# Patient Record
Sex: Male | Born: 1950
Health system: Southern US, Community
[De-identification: ages and names within clinical notes are randomized; demographics above are authoritative.]

## PROBLEM LIST (undated history)

## (undated) DIAGNOSIS — R1310 Dysphagia, unspecified: Secondary | ICD-10-CM

## (undated) DIAGNOSIS — I10 Essential (primary) hypertension: Secondary | ICD-10-CM

## (undated) DIAGNOSIS — E785 Hyperlipidemia, unspecified: Secondary | ICD-10-CM

## (undated) DIAGNOSIS — I2510 Atherosclerotic heart disease of native coronary artery without angina pectoris: Secondary | ICD-10-CM

## (undated) HISTORY — PX: HERNIA REPAIR: SHX51

## (undated) HISTORY — DX: Essential (primary) hypertension: I10

## (undated) HISTORY — DX: Dysphagia, unspecified: R13.10

## (undated) HISTORY — DX: Hyperlipidemia, unspecified: E78.5

## (undated) HISTORY — PX: CORONARY ANGIOPLASTY WITH STENT PLACEMENT: SHX49

---

## 2003-06-18 ENCOUNTER — Other Ambulatory Visit: Payer: Self-pay

## 2008-09-17 ENCOUNTER — Emergency Department: Payer: Self-pay | Admitting: Emergency Medicine

## 2008-10-18 ENCOUNTER — Ambulatory Visit: Payer: Self-pay | Admitting: General Surgery

## 2008-10-23 ENCOUNTER — Ambulatory Visit: Payer: Self-pay | Admitting: General Surgery

## 2008-10-28 ENCOUNTER — Ambulatory Visit: Payer: Self-pay | Admitting: General Surgery

## 2010-02-27 ENCOUNTER — Emergency Department: Payer: Self-pay | Admitting: Emergency Medicine

## 2016-02-16 ENCOUNTER — Telehealth: Payer: Self-pay | Admitting: Internal Medicine

## 2016-02-16 ENCOUNTER — Encounter: Payer: Self-pay | Admitting: Unknown Physician Specialty

## 2016-02-16 ENCOUNTER — Ambulatory Visit (INDEPENDENT_AMBULATORY_CARE_PROVIDER_SITE_OTHER): Payer: PRIVATE HEALTH INSURANCE | Admitting: Unknown Physician Specialty

## 2016-02-16 VITALS — BP 153/91 | HR 66 | Temp 98.3°F | Ht 68.0 in | Wt 176.2 lb

## 2016-02-16 DIAGNOSIS — I10 Essential (primary) hypertension: Secondary | ICD-10-CM

## 2016-02-16 DIAGNOSIS — Z0000 Encounter for general adult medical examination without abnormal findings: Secondary | ICD-10-CM

## 2016-02-16 DIAGNOSIS — Z23 Encounter for immunization: Secondary | ICD-10-CM

## 2016-02-16 DIAGNOSIS — I2510 Atherosclerotic heart disease of native coronary artery without angina pectoris: Secondary | ICD-10-CM | POA: Diagnosis not present

## 2016-02-16 DIAGNOSIS — K222 Esophageal obstruction: Secondary | ICD-10-CM | POA: Diagnosis not present

## 2016-02-16 DIAGNOSIS — E785 Hyperlipidemia, unspecified: Secondary | ICD-10-CM

## 2016-02-16 MED ORDER — LISINOPRIL 10 MG PO TABS: 10.0000 mg | ORAL_TABLET | Freq: Every day | ORAL | 1 refills | Status: DC

## 2016-02-16 NOTE — Assessment & Plan Note (Signed)
Lost to f/u.

## 2016-02-16 NOTE — Patient Instructions (Signed)
Pneumococcal Conjugate Vaccine (PCV13)   1. Why get vaccinated?  Vaccination can protect both children and adults from pneumococcal disease.  Pneumococcal disease is caused by bacteria that can spread from person to person through close contact. It can cause ear infections, and it can also lead to more serious infections of the:  · Lungs (pneumonia),  · Blood (bacteremia), and  · Covering of the brain and spinal cord (meningitis).  Pneumococcal pneumonia is most common among adults. Pneumococcal meningitis can cause deafness and brain damage, and it kills about 1 child in 10 who get it.  Anyone can get pneumococcal disease, but children under 2 years of age and adults 65 years and older, people with certain medical conditions, and cigarette smokers are at the highest risk.  Before there was a vaccine, the [place] saw:  · more than 700 cases of meningitis,  · about 13,000 blood infections,  · about 5 million ear infections, and  · about 200 deaths  in children under 5 each year from pneumococcal disease. Since vaccine became available, severe pneumococcal disease in these children has fallen by 88%.  About 18,000 older adults die of pneumococcal disease each year in the [place].  Treatment of pneumococcal infections with penicillin and other drugs is not as effective as it used to be, because some strains of the disease have become resistant to these drugs. This makes prevention of the disease, through vaccination, even more important.  2. PCV13 vaccine  Pneumococcal conjugate vaccine (called PCV13) protects against 13 types of pneumococcal bacteria.  PCV13 is routinely given to children at 2, 4, 6, and 12-15 months of age. It is also recommended for children and adults 2 to 64 years of age with certain health conditions, and for all adults 65 years of age and older. Your doctor can give you details.  3. Some people should not get this vaccine  Anyone who has ever had a life-threatening allergic reaction  to a dose of this vaccine, to an earlier pneumococcal vaccine called PCV7, or to any vaccine containing diphtheria toxoid (for example, DTaP), should not get PCV13.  Anyone with a severe allergy to any component of PCV13 should not get the vaccine. Tell your doctor if the person being vaccinated has any severe allergies.  If the person scheduled for vaccination is not feeling well, your healthcare provider might decide to reschedule the shot on another day.  4. Risks of a vaccine reaction  With any medicine, including vaccines, there is a chance of reactions. These are usually mild and go away on their own, but serious reactions are also possible.  Problems reported following PCV13 varied by age and dose in the series. The most common problems reported among children were:  · About half became drowsy after the shot, had a temporary loss of appetite, or had redness or tenderness where the shot was given.  · About 1 out of 3 had swelling where the shot was given.  · About 1 out of 3 had a mild fever, and about 1 in 20 had a fever over 102.2°F.  · Up to about 8 out of 10 became fussy or irritable.  Adults have reported pain, redness, and swelling where the shot was given; also mild fever, fatigue, headache, chills, or muscle pain.  Young children who get PCV13 along with inactivated flu vaccine at the same time may be at increased risk for seizures caused by fever. Ask your doctor for more information.  Problems that   could happen after any vaccine:  · People sometimes faint after a medical procedure, including vaccination. Sitting or lying down for about 15 minutes can help prevent fainting, and injuries caused by a fall. Tell your doctor if you feel dizzy, or have vision changes or ringing in the ears.  · Some older children and adults get severe pain in the shoulder and have difficulty moving the arm where a shot was given. This happens very rarely.  · Any medication can cause a severe allergic reaction. Such  reactions from a vaccine are very rare, estimated at about 1 in a million doses, and would happen within a few minutes to a few hours after the vaccination.  As with any medicine, there is a very small chance of a vaccine causing a serious injury or death.  The safety of vaccines is always being monitored. For more information, visit: www.cdc.gov/vaccinesafety/  5. What if there is a serious reaction?  What should I look for?  · Look for anything that concerns you, such as signs of a severe allergic reaction, very high fever, or unusual behavior.  Signs of a severe allergic reaction can include hives, swelling of the face and throat, difficulty breathing, a fast heartbeat, dizziness, and weakness-usually within a few minutes to a few hours after the vaccination.  What should I do?  · If you think it is a severe allergic reaction or other emergency that can't wait, call 9-1-1 or get the person to the nearest hospital. Otherwise, call your doctor.  Reactions should be reported to the Vaccine Adverse Event Reporting System (VAERS). Your doctor should file this report, or you can do it yourself through the VAERS web site at www.vaers.hhs.gov, or by calling 1-800-822-7967.  VAERS does not give medical advice.  6. The National Vaccine Injury Compensation Program  The National Vaccine Injury Compensation Program (VICP) is a federal program that was created to compensate people who may have been injured by certain vaccines.  Persons who believe they may have been injured by a vaccine can learn about the program and about filing a claim by calling 1-800-338-2382 or visiting the VICP website at www.hrsa.gov/vaccinecompensation. There is a time limit to file a claim for compensation.  7. How can I learn more?  · Ask your healthcare provider. He or she can give you the vaccine package insert or suggest other sources of information.  · Call your local or state health department.  · Contact the Centers for Disease Control and  Prevention (CDC):    Call 1-800-232-4636 (1-800-CDC-INFO) or    Visit CDC's website at www.cdc.gov/vaccines  Vaccine Information Statement  PCV13 Vaccine (05/02/2014)     This information is not intended to replace advice given to you by your health care provider. Make sure you discuss any questions you have with your health care provider.     Document Released: 04/11/2006 Document Revised: 07/05/2014 Document Reviewed: 05/09/2014  Elsevier Interactive Patient Education ©2016 [employer] Inc[employer]

## 2016-02-16 NOTE — Telephone Encounter (Signed)
Patient is aware 

## 2016-02-16 NOTE — Assessment & Plan Note (Signed)
Refer to GI 

## 2016-02-16 NOTE — Progress Notes (Signed)
BP (!) 153/91 (BP Location: Left Arm, Cuff Size: Large)   Pulse 66   Temp 98.3 F (36.8 C)   Ht 5\' 8"  (1.727 m)   Wt 176 lb 3.2 oz (79.9 kg)   SpO2 97%   BMI 26.79 kg/m    Subjective:    Patient ID: Michael Zuniga, male    DOB: 01-06-1951, 65 y.o.   MRN: [id number]  HPI: Michael Zuniga is a 65 y.o. male  Chief Complaint  Patient presents with  . Establish Care  . Labs Only    Hep C order entered   Pt is here to reestablish as a pt.  His primary concern is problems with swallowing.  States he sometimes tries to swallow something and can't get it all the way down.  The other day it lasted for about 24 hours.  Could not even swallow water without vomiting.  He is taking no medications for GERD  Hypertension Had been on BP meds in the past.   Average home BPs 120-130's/80   No problems or lightheadedness No chest pain with exertion or shortness of breath No Edema  CAD Lost to f/u.  Can't tell me when he got his stent. Social History   Social History  . Marital status: Married    Spouse name: N/A  . Number of children: N/A  . Years of education: N/A   Occupational History  . Not on file.   Social History Main Topics  . Smoking status: Never Smoker  . Smokeless tobacco: Never Used  . Alcohol use No  . Drug use: No  . Sexual activity: Yes   Other Topics Concern  . Not on file   Social History Narrative  . No narrative on file   Family History  Problem Relation Age of Onset  . Thyroid disease Daughter   . Arthritis Maternal Grandmother    History reviewed. No pertinent past medical history.  Past Surgical History:  Procedure Laterality Date  . CORONARY ANGIOPLASTY WITH STENT PLACEMENT    . HERNIA REPAIR      Relevant past medical, surgical, family and social history reviewed and updated as indicated. Interim medical history since our last visit reviewed. Allergies and medications reviewed and updated.  Review of Systems  Musculoskeletal: Positive for  arthralgias and myalgias.    Per HPI unless specifically indicated above     Objective:    BP (!) 153/91 (BP Location: Left Arm, Cuff Size: Large)   Pulse 66   Temp 98.3 F (36.8 C)   Ht 5\' 8"  (1.727 m)   Wt 176 lb 3.2 oz (79.9 kg)   SpO2 97%   BMI 26.79 kg/m   Wt Readings from Last 3 Encounters:  02/16/16 176 lb 3.2 oz (79.9 kg)    Physical Exam  Constitutional: He is oriented to person, place, and time. He appears well-developed and well-nourished. No distress.  HENT:  Head: Normocephalic and atraumatic.  Eyes: Conjunctivae and lids are normal. Right eye exhibits no discharge. Left eye exhibits no discharge. No scleral icterus.  Neck: Normal range of motion. Neck supple. No JVD present. Carotid bruit is not present.  Cardiovascular: Normal rate, regular rhythm and normal heart sounds.   Pulmonary/Chest: Effort normal and breath sounds normal. No respiratory distress.  Abdominal: Normal appearance. There is no splenomegaly or hepatomegaly.  Musculoskeletal: Normal range of motion.  Neurological: He is alert and oriented to person, place, and time.  Skin: Skin is warm, dry and intact.  No rash noted. No pallor.  Psychiatric: He has a normal mood and affect. His behavior is normal. Judgment and thought content normal.    No results found for this or any previous visit.    Assessment & Plan:   Problem List Items Addressed This Visit      Unprioritized   CAD (coronary artery disease)    Lost to f/u      Relevant Medications   aspirin EC 81 MG tablet   lisinopril (PRINIVIL,ZESTRIL) 10 MG tablet   Other Relevant Orders   Lipid Panel w/o Chol/HDL Ratio   Hyperlipidemia    Stopped taking statins as "I heard so much about it."  Refusing at this time      Relevant Medications   aspirin EC 81 MG tablet   lisinopril (PRINIVIL,ZESTRIL) 10 MG tablet   Hypertension    Start Lisinopril 10 mg.        Relevant Medications   aspirin EC 81 MG tablet   lisinopril  (PRINIVIL,ZESTRIL) 10 MG tablet   Other Relevant Orders   Comprehensive metabolic panel   Stricture and stenosis of esophagus    Refer to GI      Relevant Orders   Ambulatory referral to Gastroenterology   CBC with Differential/Platelet    Other Visit Diagnoses    Health care maintenance    -  Primary   Relevant Orders   Hepatitis C antibody   TSH   Need for pneumococcal vaccination       Relevant Orders   Pneumococcal conjugate vaccine 13-valent IM (Completed)       Follow up plan: Return in about 4 weeks (around 03/15/2016).

## 2016-02-16 NOTE — Assessment & Plan Note (Signed)
Start Lisinopril 10 mg.

## 2016-02-16 NOTE — Assessment & Plan Note (Signed)
Stopped taking statins as "I heard so much about it."  Refusing at this time

## 2016-02-16 NOTE — Telephone Encounter (Signed)
Pt scheduled to see Amy Esterwood PA 02/24/16@1 :30pm, please notify pt of appt.

## 2016-02-17 ENCOUNTER — Encounter: Payer: Self-pay | Admitting: Unknown Physician Specialty

## 2016-02-17 ENCOUNTER — Telehealth: Payer: Self-pay | Admitting: Unknown Physician Specialty

## 2016-02-17 LAB — CBC WITH DIFFERENTIAL/PLATELET
Basophils Absolute: 0 10*3/uL (ref 0.0–0.2)
Basos: 0 %
EOS (ABSOLUTE): 0.1 10*3/uL (ref 0.0–0.4)
Eos: 2 %
Hematocrit: 47.9 % (ref 37.5–51.0)
Hemoglobin: 16 g/dL (ref 12.6–17.7)
Immature Grans (Abs): 0 10*3/uL (ref 0.0–0.1)
Immature Granulocytes: 0 %
Lymphocytes Absolute: 1.4 10*3/uL (ref 0.7–3.1)
Lymphs: 21 %
MCH: 32.5 pg (ref 26.6–33.0)
MCHC: 33.4 g/dL (ref 31.5–35.7)
MCV: 97 fL (ref 79–97)
Monocytes Absolute: 0.7 10*3/uL (ref 0.1–0.9)
Monocytes: 10 %
Neutrophils Absolute: 4.6 10*3/uL (ref 1.4–7.0)
Neutrophils: 67 %
Platelets: 268 10*3/uL (ref 150–379)
RBC: 4.93 x10E6/uL (ref 4.14–5.80)
RDW: 14.3 % (ref 12.3–15.4)
WBC: 6.8 10*3/uL (ref 3.4–10.8)

## 2016-02-17 LAB — COMPREHENSIVE METABOLIC PANEL
ALT: 24 IU/L (ref 0–44)
AST: 23 IU/L (ref 0–40)
Albumin/Globulin Ratio: 1.8 (ref 1.2–2.2)
Albumin: 4.8 g/dL (ref 3.6–4.8)
Alkaline Phosphatase: 73 IU/L (ref 39–117)
BUN/Creatinine Ratio: 19 (ref 10–24)
BUN: 19 mg/dL (ref 8–27)
Bilirubin Total: 0.6 mg/dL (ref 0.0–1.2)
CO2: 23 mmol/L (ref 18–29)
Calcium: 9.9 mg/dL (ref 8.6–10.2)
Chloride: 101 mmol/L (ref 96–106)
Creatinine, Ser: 1.02 mg/dL (ref 0.76–1.27)
GFR calc Af Amer: 88 mL/min/{1.73_m2} (ref >59)
GFR calc non Af Amer: 76 mL/min/{1.73_m2} (ref >59)
Globulin, Total: 2.7 g/dL (ref 1.5–4.5)
Glucose: 100 mg/dL — ABNORMAL HIGH (ref 65–99)
Potassium: 4.7 mmol/L (ref 3.5–5.2)
Sodium: 141 mmol/L (ref 134–144)
Total Protein: 7.5 g/dL (ref 6.0–8.5)

## 2016-02-17 LAB — LIPID PANEL W/O CHOL/HDL RATIO
Cholesterol, Total: 289 mg/dL — ABNORMAL HIGH (ref 100–199)
HDL: 57 mg/dL (ref >39)
LDL Calculated: 197 mg/dL — ABNORMAL HIGH (ref 0–99)
Triglycerides: 174 mg/dL — ABNORMAL HIGH (ref 0–149)
VLDL Cholesterol Cal: 35 mg/dL (ref 5–40)

## 2016-02-17 LAB — TSH: TSH: 1.28 u[IU]/mL (ref 0.450–4.500)

## 2016-02-17 LAB — HEPATITIS C ANTIBODY: Hep C Virus Ab: <0.1 s/co ratio (ref 0.0–0.9)

## 2016-02-17 MED ORDER — ATORVASTATIN CALCIUM 20 MG PO TABS: 20.0000 mg | ORAL_TABLET | Freq: Every day | ORAL | 1 refills | Status: DC

## 2016-02-17 NOTE — Telephone Encounter (Signed)
Unable to reach pt

## 2016-02-17 NOTE — Telephone Encounter (Signed)
Left message and sent letter about need for statin with high cholesterol and CAD.  Will call in Atorvastatin

## 2016-02-24 ENCOUNTER — Ambulatory Visit (INDEPENDENT_AMBULATORY_CARE_PROVIDER_SITE_OTHER): Payer: PRIVATE HEALTH INSURANCE | Admitting: Physician Assistant

## 2016-02-24 ENCOUNTER — Encounter: Payer: Self-pay | Admitting: Physician Assistant

## 2016-02-24 VITALS — BP 160/98 | HR 68 | Ht 68.0 in | Wt 180.4 lb

## 2016-02-24 DIAGNOSIS — Z1211 Encounter for screening for malignant neoplasm of colon: Secondary | ICD-10-CM

## 2016-02-24 DIAGNOSIS — R1310 Dysphagia, unspecified: Secondary | ICD-10-CM

## 2016-02-24 MED ORDER — NA SULFATE-K SULFATE-MG SULF 17.5-3.13-1.6 GM/177ML PO SOLN: 1.0000 | Freq: Once | ORAL | 0 refills | Status: AC

## 2016-02-24 NOTE — Patient Instructions (Addendum)
You have been scheduled for an endoscopy and colonoscopy. Please follow the written instructions given to you at your visit today. Please pick up your prep supplies at the pharmacy within the next 1-3 days. [place].  If you use inhalers (even only as needed), please bring them with you on the day of your procedure. Your physician has requested that you go to www.startemmi.com and enter the access code given to you at your visit today. This web site gives a general overview about your procedure. However, you should still follow specific instructions given to you by our office regarding your preparation for the procedure.    Today your blood pressure was elevated. Please follow up with your Primary Care Provider for blood pressure management.   If you are age 44 or older, your body mass index should be between 23-30. Your Body mass index is 27.43 kg/m. If this is out of the aforementioned range listed, please consider follow up with your Primary Care Provider.

## 2016-02-24 NOTE — Progress Notes (Addendum)
Subjective:    Patient ID: Michael Zuniga, male    DOB: Aug 10, 1950, 65 y.o.   MRN: [id number]  HPI Reyhan is a pleasant 65 year old white male, new to GI today referred by Kathrine Haddock N.P / family medicine for  evaluation of dysphagia. Patient has not had any prior GI evaluation. He says he's been having intermittent dysphagia over the past couple of years. He says these episodes are sporadic and probably occur about once a month. He will eat solid food and feel as if food has not gone down into his stomach. Sometimes he tries to regurgitate other times the food will gradually gone down with time and sipping of liquids. He had a particularly bad episode about a week and a half ago after eating breakfast and still felt that he had food stuck is the following morning. He was unable to eat or drink anything and his saliva started coming back up. Eventually the following morning his symptoms gradually subsided and he hasn't had another significant episode since. His weight has been stable, his appetite is good and he denies any problems with heartburn or indigestion. Patient has not had any prior colon screening, family history is negative for colon cancer as far as he is aware. He has no complaints of abdominal pain and changes in bowel habits melena or hematochezia. He is agreeable to colonoscopy.  Review of Systems Pertinent positive and negative review of systems were noted in the above HPI section.  All other review of systems was otherwise negative.  Outpatient Encounter Prescriptions as of 02/24/2016  Medication Sig  . aspirin EC 81 MG tablet Take 81 mg by mouth daily.  Marland Kitchen atorvastatin (LIPITOR) 20 MG tablet Take 1 tablet (20 mg total) by mouth daily.  Marland Kitchen lisinopril (PRINIVIL,ZESTRIL) 10 MG tablet Take 1 tablet (10 mg total) by mouth daily.  . Na Sulfate-K Sulfate-Mg Sulf 17.5-3.13-1.6 GM/180ML SOLN Take 1 kit by mouth once.   No facility-administered encounter medications on file as of 02/24/2016.     No Known Allergies Patient Active Problem List   Diagnosis Date Noted  . Stricture and stenosis of esophagus 02/16/2016  . Hypertension 02/16/2016  . Hyperlipidemia 02/16/2016  . CAD (coronary artery disease) 02/16/2016   Social History   Social History  . Marital status: Married    Spouse name: N/A  . Number of children: 2  . Years of education: N/A   Occupational History  . machine operator    Social History Main Topics  . Smoking status: Never Smoker  . Smokeless tobacco: Never Used  . Alcohol use No  . Drug use: No  . Sexual activity: Yes   Other Topics Concern  . Not on file   Social History Narrative  . No narrative on file    Mr. Lo family history includes Arthritis in his maternal grandmother; Lung cancer in his father and mother; Thyroid disease in his daughter.      Objective:    Vitals:   02/24/16 1331  BP: (!) 160/98  Pulse: 68    Physical Exam  well-developed white male in no acute distress, pleasant blood pressure 160/98 pulse 68, height 5 foot 8 weight 180 BMI 27. HEENT; nontraumatic normocephalic EOMI PERRLA sclera anicteric, Cardiovascular; regular rate and rhythm with S1-S2 no murmur or gallop, Pulmonary clear bilaterally, Abdomen ;soft nontender nondistended bowel sounds are active there is no palpable mass or hepatosplenomegaly, Rectal; exam not done, Extremities ;no clubbing cyanosis or edema skin warm and dry,  Neuropsych ;mood and affect appropriate`       Assessment & Plan:   #65 65 year old white male with intermittent solid food dysphagia 2 years and a recent episode of food impaction which resolved on its own after 24 hours. Suspect esophageal stricture, versus ring  #2 colon cancer screening-patient asymptomatic and of average risk, no prior colonoscopy #3 hypertension  Plan; Options were discussed with the patient and his wife. We will proceed with EGD and probable dilation ,and Colonoscopy. Procedures will be scheduled  with Dr. Hilarie Fredrickson. I discussed risks and benefits in detail with patient and he is agreeable to proceed.   Gabbie Marzo S Clydie Dillen PA-C 02/24/2016   Cc: Kathrine Haddock, NP  Addendum: Reviewed and agree with initial management. Jerene Bears, MD

## 2016-02-25 ENCOUNTER — Encounter: Payer: Self-pay | Admitting: Internal Medicine

## 2016-03-02 ENCOUNTER — Encounter: Payer: Self-pay | Admitting: Internal Medicine

## 2016-03-02 ENCOUNTER — Ambulatory Visit (AMBULATORY_SURGERY_CENTER): Payer: PRIVATE HEALTH INSURANCE | Admitting: Internal Medicine

## 2016-03-02 VITALS — BP 122/75 | HR 60 | Temp 97.7°F | Resp 15 | Ht 68.0 in | Wt 180.0 lb

## 2016-03-02 DIAGNOSIS — R1310 Dysphagia, unspecified: Secondary | ICD-10-CM | POA: Diagnosis present

## 2016-03-02 DIAGNOSIS — D122 Benign neoplasm of ascending colon: Secondary | ICD-10-CM | POA: Diagnosis not present

## 2016-03-02 DIAGNOSIS — D127 Benign neoplasm of rectosigmoid junction: Secondary | ICD-10-CM

## 2016-03-02 DIAGNOSIS — Z1211 Encounter for screening for malignant neoplasm of colon: Secondary | ICD-10-CM

## 2016-03-02 DIAGNOSIS — K635 Polyp of colon: Secondary | ICD-10-CM

## 2016-03-02 DIAGNOSIS — D124 Benign neoplasm of descending colon: Secondary | ICD-10-CM | POA: Diagnosis not present

## 2016-03-02 MED ORDER — SODIUM CHLORIDE 0.9 % IV SOLN: 500.0000 mL | INTRAVENOUS | Status: DC

## 2016-03-02 NOTE — Patient Instructions (Addendum)
YOU HAD AN ENDOSCOPIC PROCEDURE TODAY AT [facility]:   Refer to the procedure report that was given to you for any specific questions about what was found during the examination.  If the procedure report does not answer your questions, please call your gastroenterologist to clarify.  If you requested that your care partner not be given the details of your procedure findings, then the procedure report has been included in a sealed envelope for you to review at your convenience later.  YOU SHOULD EXPECT: Some feelings of bloating in the abdomen. Passage of more gas than usual.  Walking can help get rid of the air that was put into your GI tract during the procedure and reduce the bloating. If you had a lower endoscopy (such as a colonoscopy or flexible sigmoidoscopy) you may notice spotting of blood in your stool or on the toilet paper. If you underwent a bowel prep for your procedure, you may not have a normal bowel movement for a few days.  Please Note:  You might notice some irritation and congestion in your nose or some drainage.  This is from the oxygen used during your procedure.  There is no need for concern and it should clear up in a day or so.  SYMPTOMS TO REPORT IMMEDIATELY:   Following lower endoscopy (colonoscopy or flexible sigmoidoscopy):  Excessive amounts of blood in the stool  Significant tenderness or worsening of abdominal pains  Swelling of the abdomen that is new, acute  Fever of 100F or higher   Following upper endoscopy (EGD)  Vomiting of blood or coffee ground material  New chest pain or pain under the shoulder blades  Painful or persistently difficult swallowing  New shortness of breath  Fever of 100F or higher  Black, tarry-looking stools  For urgent or emergent issues, a gastroenterologist can be reached at any hour by calling [phone].   DIET:  Please follow the dilatation diet on the handout provided the rest of today.  Drink plenty of  fluids but you should avoid alcoholic beverages for 24 hours.  ACTIVITY:  You should plan to take it easy for the rest of today and you should NOT DRIVE or use heavy machinery until tomorrow (because of the sedation medicines used during the test).    FOLLOW UP: Our staff will call the number listed on your records the next business day following your procedure to check on you and address any questions or concerns that you may have regarding the information given to you following your procedure. If we do not reach you, we will leave a message.  However, if you are feeling well and you are not experiencing any problems, there is no need to return our call.  We will assume that you have returned to your regular daily activities without incident.  If any biopsies were taken you will be contacted by phone or by letter within the next 1-3 weeks.  Please call us at [phone] if you have not heard about the biopsies in 3 weeks.    SIGNATURES/CONFIDENTIALITY: You and/or your care partner have signed paperwork which will be entered into your electronic medical record.  These signatures attest to the fact that that the information above on your After Visit Summary has been reviewed and is understood.  Full responsibility of the confidentiality of this discharge information lies with you and/or your care-partner.    Handouts were given to your care partner on hiatal hernia, esophageal dilatation diet  to follow the rest of today, polyps, diverticulosis, hemorrhoids and a high fiber diet with liberal fluid intake. You may resume your current medications today. Await biopsy results. Please call if any questions or concerns.

## 2016-03-02 NOTE — Progress Notes (Signed)
No problems noted in the recovery room. maw 

## 2016-03-02 NOTE — Progress Notes (Signed)
To recovery, report to Willis, RN, VSS 

## 2016-03-02 NOTE — Op Note (Signed)
[name] Patient Name: Michael Zuniga Procedure Date: 03/02/2016 2:58 PM MRN: [id number] Endoscopist: Jerene Bears , MD Age: 65 Referring MD:  Date of Birth: 06/10/1951 Gender: Male Account #: [id number] Procedure:                Upper GI endoscopy Indications:              Dysphagia Medicines:                Monitored Anesthesia Care Procedure:                Pre-Anesthesia Assessment:                           - Prior to the procedure, a History and Physical                            was performed, and patient medications and                            allergies were reviewed. The patient's tolerance of                            previous anesthesia was also reviewed. The risks                            and benefits of the procedure and the sedation                            options and risks were discussed with the patient.                            All questions were answered, and informed consent                            was obtained. Prior Anticoagulants: The patient has                            taken no previous anticoagulant or antiplatelet                            agents. ASA Grade Assessment: II - A patient with                            mild systemic disease. After reviewing the risks                            and benefits, the patient was deemed in                            satisfactory condition to undergo the procedure.                           After obtaining informed consent, the endoscope was  passed under direct vision. Throughout the                            procedure, the patient's blood pressure, pulse, and                            oxygen saturations were monitored continuously. The                            Model GIF-HQ190 [id number]) scope was introduced                            through the mouth, and advanced to the second part                            of duodenum. The upper GI endoscopy was                  accomplished without difficulty. The patient                            tolerated the procedure well. Scope In: Scope Out: Findings:                 A low-grade of narrowing Schatzki ring (acquired)                            was found at the gastroesophageal junction, 37 cm                            from the incisors. A TTS dilator was passed through                            the scope. Dilation with a 16-17-18 mm balloon                            dilator was performed to 18 mm. The dilation site                            was examined and showed improvement in luminal                            narrowing with self-limited oozing. Estimated blood                            loss was minimal.                           A 3 cm hiatal hernia was present (37 cm - 40 cm                            from the incisors).                           The entire examined stomach was normal.  The examined duodenum was normal. Complications:            No immediate complications. Estimated Blood Loss:     Estimated blood loss was minimal. Impression:               - Low-grade of narrowing Schatzki ring. Dilated.                           - 3 cm hiatal hernia.                           - Normal stomach.                           - Normal examined duodenum.                           - No specimens collected. Recommendation:           - Patient has a contact number available for                            emergencies. The signs and symptoms of potential                            delayed complications were discussed with the                            patient. Return to normal activities tomorrow.                            Written discharge instructions were provided to the                            patient.                           - Soft diet today, then advance as tolerated.                           - Continue present medications.                            - Repeat upper endoscopy as needed for retreatment. Jerene Bears, MD 03/02/2016 3:40:46 PM This report has been signed electronically.

## 2016-03-02 NOTE — Progress Notes (Signed)
Called to room to assist during endoscopic procedure.  Patient ID and intended procedure confirmed with present staff. Received instructions for my participation in the procedure from the performing physician.  

## 2016-03-02 NOTE — Op Note (Signed)
[place] Patient Name: Michael Zuniga Procedure Date: 03/02/2016 2:57 PM MRN: [id number] Endoscopist: Jerene Bears , MD Age: 65 Referring MD:  Date of Birth: 1950/12/22 Gender: Male Account #: [id number] Procedure:                Colonoscopy Indications:              Screening for colorectal malignant neoplasm, This                            is the patient's first colonoscopy Medicines:                Monitored Anesthesia Care Procedure:                Pre-Anesthesia Assessment:                           - Prior to the procedure, a History and Physical                            was performed, and patient medications and                            allergies were reviewed. The patient's tolerance of                            previous anesthesia was also reviewed. The risks                            and benefits of the procedure and the sedation                            options and risks were discussed with the patient.                            All questions were answered, and informed consent                            was obtained. Prior Anticoagulants: The patient has                            taken no previous anticoagulant or antiplatelet                            agents. ASA Grade Assessment: II - A patient with                            mild systemic disease. After reviewing the risks                            and benefits, the patient was deemed in                            satisfactory condition to undergo the procedure.  After obtaining informed consent, the colonoscope                            was passed under direct vision. Throughout the                            procedure, the patient's blood pressure, pulse, and                            oxygen saturations were monitored continuously. The                            Model CF-HQ190L [id number]) scope was introduced                            through the anus and advanced to  the the cecum,                            identified by appendiceal orifice and ileocecal                            valve. The colonoscopy was performed without                            difficulty. The patient tolerated the procedure                            well. The quality of the bowel preparation was                            good. The ileocecal valve, appendiceal orifice, and                            rectum were photographed. Scope In: 3:23:05 PM Scope Out: 3:34:52 PM Scope Withdrawal Time: 0 hours 10 minutes 8 seconds  Total Procedure Duration: 0 hours 11 minutes 47 seconds  Findings:                 The digital rectal exam was normal.                           A 6 mm polyp was found in the ascending colon. The                            polyp was sessile. The polyp was removed with a                            cold snare. Resection and retrieval were complete.                           A 5 mm polyp was found in the descending colon. The                            polyp was sessile.  The polyp was removed with a                            cold snare. Resection and retrieval were complete.                           A 2 mm polyp was found in the recto-sigmoid colon.                            The polyp was sessile. The polyp was removed with a                            cold snare. Resection and retrieval were complete.                           Multiple small and large-mouthed diverticula were                            found from transverse colon to sigmoid colon.                           Internal hemorrhoids were found during                            retroflexion. The hemorrhoids were medium-sized. Complications:            No immediate complications. Estimated Blood Loss:     Estimated blood loss was minimal. Impression:               - One 6 mm polyp in the ascending colon, removed                            with a cold snare. Resected and retrieved.                            - One 5 mm polyp in the descending colon, removed                            with a cold snare. Resected and retrieved.                           - One 2 mm polyp at the recto-sigmoid colon,                            removed with a cold snare. Resected and retrieved.                           - Moderate diverticulosis from transverse colon to                            sigmoid colon.                           - Internal hemorrhoids. Recommendation:           -  Patient has a contact number available for                            emergencies. The signs and symptoms of potential                            delayed complications were discussed with the                            patient. Return to normal activities tomorrow.                            Written discharge instructions were provided to the                            patient.                           - Resume previous diet.                           - Continue present medications.                           - Await pathology results.                           - Repeat colonoscopy is recommended. The                            colonoscopy date will be determined after pathology                            results from today's exam become available for                            review. Jerene Bears, MD 03/02/2016 3:44:37 PM This report has been signed electronically.

## 2016-03-03 ENCOUNTER — Telehealth: Payer: Self-pay

## 2016-03-03 NOTE — Telephone Encounter (Signed)
Left message on answering machine. 

## 2016-03-04 ENCOUNTER — Encounter: Payer: Self-pay | Admitting: Internal Medicine

## 2016-03-17 ENCOUNTER — Ambulatory Visit (INDEPENDENT_AMBULATORY_CARE_PROVIDER_SITE_OTHER): Payer: PRIVATE HEALTH INSURANCE | Admitting: Unknown Physician Specialty

## 2016-03-17 ENCOUNTER — Encounter: Payer: Self-pay | Admitting: Unknown Physician Specialty

## 2016-03-17 VITALS — BP 153/88 | HR 61 | Temp 97.7°F | Ht 68.6 in | Wt 182.4 lb

## 2016-03-17 DIAGNOSIS — E785 Hyperlipidemia, unspecified: Secondary | ICD-10-CM | POA: Diagnosis not present

## 2016-03-17 DIAGNOSIS — I10 Essential (primary) hypertension: Secondary | ICD-10-CM

## 2016-03-17 DIAGNOSIS — Z23 Encounter for immunization: Secondary | ICD-10-CM

## 2016-03-17 MED ORDER — AMLODIPINE BESYLATE 5 MG PO TABS: 5.0000 mg | ORAL_TABLET | Freq: Every day | ORAL | 0 refills | Status: DC

## 2016-03-17 MED ORDER — SILDENAFIL CITRATE 20 MG PO TABS: ORAL_TABLET | ORAL | 0 refills | Status: DC

## 2016-03-17 NOTE — Progress Notes (Signed)
BP (!) 153/88 (BP Location: Left Arm, Patient Position: Sitting, Cuff Size: Large)   Pulse 61   Temp 97.7 F (36.5 C)   Ht 5' 8.6" (1.742 m)   Wt 182 lb 6.4 oz (82.7 kg)   SpO2 98%   BMI 27.25 kg/m    Subjective:    Patient ID: Michael Zuniga, male    DOB: October 23, 1950, 65 y.o.   MRN: [id number]  HPI: Michael Zuniga is a 65 y.o. male  Chief Complaint  Patient presents with  . Hyperlipidemia  . Hypertension   Hypertension Using medications without difficulty Average home BP Not checking  No problems or lightheadedness No chest pain with exertion or shortness of breath No Edema   Hyperlipidemia Started on statin last month for a very high cholesterol   Using medications without problems: No Muscle aches  Exercise: works  ED Having trouble with having and maintaining erections.    Relevant past medical, surgical, family and social history reviewed and updated as indicated. Interim medical history since our last visit reviewed. Allergies and medications reviewed and updated.  Review of Systems  Per HPI unless specifically indicated above     Objective:    BP (!) 153/88 (BP Location: Left Arm, Patient Position: Sitting, Cuff Size: Large)   Pulse 61   Temp 97.7 F (36.5 C)   Ht 5' 8.6" (1.742 m)   Wt 182 lb 6.4 oz (82.7 kg)   SpO2 98%   BMI 27.25 kg/m   Wt Readings from Last 3 Encounters:  03/17/16 182 lb 6.4 oz (82.7 kg)  03/02/16 180 lb (81.6 kg)  02/24/16 180 lb 6 oz (81.8 kg)    Physical Exam  Constitutional: He is oriented to person, place, and time. He appears well-developed and well-nourished. No distress.  HENT:  Head: Normocephalic and atraumatic.  Eyes: Conjunctivae and lids are normal. Right eye exhibits no discharge. Left eye exhibits no discharge. No scleral icterus.  Neck: Normal range of motion. Neck supple. No JVD present. Carotid bruit is not present.  Cardiovascular: Normal rate, regular rhythm and normal heart sounds.   Pulmonary/Chest:  Effort normal and breath sounds normal. No respiratory distress.  Abdominal: Normal appearance. There is no splenomegaly or hepatomegaly.  Musculoskeletal: Normal range of motion.  Neurological: He is alert and oriented to person, place, and time.  Skin: Skin is warm, dry and intact. No rash noted. No pallor.  Psychiatric: He has a normal mood and affect. His behavior is normal. Judgment and thought content normal.    Results for orders placed or performed in visit on 02/16/16  Hepatitis C antibody  Result Value Ref Range   Hep C Virus Ab <0.1 0.0 - 0.9 s/co ratio  Comprehensive metabolic panel  Result Value Ref Range   Glucose 100 (H) 65 - 99 mg/dL   BUN 19 8 - 27 mg/dL   Creatinine, Ser 1.02 0.76 - 1.27 mg/dL   GFR calc non Af Amer 76 >59 mL/min/1.73   GFR calc Af Amer 88 >59 mL/min/1.73   BUN/Creatinine Ratio 19 10 - 24   Sodium 141 134 - 144 mmol/L   Potassium 4.7 3.5 - 5.2 mmol/L   Chloride 101 96 - 106 mmol/L   CO2 23 18 - 29 mmol/L   Calcium 9.9 8.6 - 10.2 mg/dL   Total Protein 7.5 6.0 - 8.5 g/dL   Albumin 4.8 3.6 - 4.8 g/dL   Globulin, Total 2.7 1.5 - 4.5 g/dL   Albumin/Globulin Ratio  1.8 1.2 - 2.2   Bilirubin Total 0.6 0.0 - 1.2 mg/dL   Alkaline Phosphatase 73 39 - 117 IU/L   AST 23 0 - 40 IU/L   ALT 24 0 - 44 IU/L  Lipid Panel w/o Chol/HDL Ratio  Result Value Ref Range   Cholesterol, Total 289 (H) 100 - 199 mg/dL   Triglycerides 174 (H) 0 - 149 mg/dL   HDL 57 >39 mg/dL   VLDL Cholesterol Cal 35 5 - 40 mg/dL   LDL Calculated 197 (H) 0 - 99 mg/dL  TSH  Result Value Ref Range   TSH 1.280 0.450 - 4.500 uIU/mL  CBC with Differential/Platelet  Result Value Ref Range   WBC 6.8 3.4 - 10.8 x10E3/uL   RBC 4.93 4.14 - 5.80 x10E6/uL   Hemoglobin 16.0 12.6 - 17.7 g/dL   Hematocrit 47.9 37.5 - 51.0 %   MCV 97 79 - 97 fL   MCH 32.5 26.6 - 33.0 pg   MCHC 33.4 31.5 - 35.7 g/dL   RDW 14.3 12.3 - 15.4 %   Platelets 268 150 - 379 x10E3/uL   Neutrophils 67 %   Lymphs 21 %     Monocytes 10 %   Eos 2 %   Basos 0 %   Neutrophils Absolute 4.6 1.4 - 7.0 x10E3/uL   Lymphocytes Absolute 1.4 0.7 - 3.1 x10E3/uL   Monocytes Absolute 0.7 0.1 - 0.9 x10E3/uL   EOS (ABSOLUTE) 0.1 0.0 - 0.4 x10E3/uL   Basophils Absolute 0.0 0.0 - 0.2 x10E3/uL   Immature Granulocytes 0 %   Immature Grans (Abs) 0.0 0.0 - 0.1 x10E3/uL      Assessment & Plan:   Problem List Items Addressed This Visit      Unprioritized   Hyperlipidemia    Check lipid panel next month      Relevant Medications   sildenafil (REVATIO) 20 MG tablet   amLODipine (NORVASC) 5 MG tablet   Hypertension    DC Lisinopril and start Amlodipine 5 mg      Relevant Medications   sildenafil (REVATIO) 20 MG tablet   amLODipine (NORVASC) 5 MG tablet    Other Visit Diagnoses    Need for shingles vaccine    -  Primary   Relevant Orders   Varicella-zoster vaccine subcutaneous (Completed)   Need for influenza vaccination       Relevant Orders   Flu vaccine HIGH DOSE PF (Completed)       Follow up plan: Return in about 4 weeks (around 04/14/2016) for BP and cholesterol.

## 2016-03-17 NOTE — Assessment & Plan Note (Signed)
Check lipid panel next month

## 2016-03-17 NOTE — Patient Instructions (Addendum)
Influenza (Flu) Vaccine (Inactivated or Recombinant):  1. Why get vaccinated? Influenza ("flu") is a contagious disease that spreads around the [place] every year, usually between October and May. Flu is caused by influenza viruses, and is spread mainly by coughing, sneezing, and close contact. Anyone can get flu. Flu strikes suddenly and can last several days. Symptoms vary by age, but can include:  fever/chills  sore throat  muscle aches  fatigue  cough  headache  runny or stuffy nose Flu can also lead to pneumonia and blood infections, and cause diarrhea and seizures in children. If you have a medical condition, such as heart or lung disease, flu can make it worse. Flu is more dangerous for some people. Infants and young children, people 65 years of age and older, pregnant women, and people with certain health conditions or a weakened immune system are at greatest risk. Each year thousands of people in the [place] die from flu, and many more are hospitalized. Flu vaccine can:  keep you from getting flu,  make flu less severe if you do get it, and  keep you from spreading flu to your family and other people. 2. Inactivated and recombinant flu vaccines A dose of flu vaccine is recommended every flu season. Children 6 months through 8 years of age may need two doses during the same flu season. Everyone else needs only one dose each flu season. Some inactivated flu vaccines contain a very small amount of a mercury-based preservative called thimerosal. Studies have not shown thimerosal in vaccines to be harmful, but flu vaccines that do not contain thimerosal are available. There is no live flu virus in flu shots. They cannot cause the flu. There are many flu viruses, and they are always changing. Each year a new flu vaccine is made to protect against three or four viruses that are likely to cause disease in the upcoming flu season. But even when the vaccine doesn't exactly  match these viruses, it may still provide some protection. Flu vaccine cannot prevent:  flu that is caused by a virus not covered by the vaccine, or  illnesses that look like flu but are not. It takes about 2 weeks for protection to develop after vaccination, and protection lasts through the flu season. 3. Some people should not get this vaccine Tell the person who is giving you the vaccine:  If you have any severe, life-threatening allergies. If you ever had a life-threatening allergic reaction after a dose of flu vaccine, or have a severe allergy to any part of this vaccine, you may be advised not to get vaccinated. Most, but not all, types of flu vaccine contain a small amount of egg protein.  If you ever had Guillain-Barre Syndrome (also called GBS). Some people with a history of GBS should not get this vaccine. This should be discussed with your doctor.  If you are not feeling well. It is usually okay to get flu vaccine when you have a mild illness, but you might be asked to come back when you feel better. 4. Risks of a vaccine reaction With any medicine, including vaccines, there is a chance of reactions. These are usually mild and go away on their own, but serious reactions are also possible. Most people who get a flu shot do not have any problems with it. Minor problems following a flu shot include:  soreness, redness, or swelling where the shot was given  hoarseness  sore, red or itchy eyes  cough    fever  aches  headache  itching  fatigue If these problems occur, they usually begin soon after the shot and last 1 or 2 days. More serious problems following a flu shot can include the following:  There may be a small increased risk of Guillain-Barre Syndrome (GBS) after inactivated flu vaccine. This risk has been estimated at 1 or 2 additional cases per million people vaccinated. This is much lower than the risk of severe complications from flu, which can be prevented by  flu vaccine.  Young children who get the flu shot along with pneumococcal vaccine (PCV13) and/or DTaP vaccine at the same time might be slightly more likely to have a seizure caused by fever. Ask your doctor for more information. Tell your doctor if a child who is getting flu vaccine has ever had a seizure. Problems that could happen after any injected vaccine:  People sometimes faint after a medical procedure, including vaccination. Sitting or lying down for about 15 minutes can help prevent fainting, and injuries caused by a fall. Tell your doctor if you feel dizzy, or have vision changes or ringing in the ears.  Some people get severe pain in the shoulder and have difficulty moving the arm where a shot was given. This happens very rarely.  Any medication can cause a severe allergic reaction. Such reactions from a vaccine are very rare, estimated at about 1 in a million doses, and would happen within a few minutes to a few hours after the vaccination. As with any medicine, there is a very remote chance of a vaccine causing a serious injury or death. The safety of vaccines is always being monitored. For more information, visit: www.cdc.gov/vaccinesafety/ 5. What if there is a serious reaction? What should I look for?  Look for anything that concerns you, such as signs of a severe allergic reaction, very high fever, or unusual behavior. Signs of a severe allergic reaction can include hives, swelling of the face and throat, difficulty breathing, a fast heartbeat, dizziness, and weakness. These would start a few minutes to a few hours after the vaccination. What should I do?  If you think it is a severe allergic reaction or other emergency that can't wait, call 9-1-1 and get the person to the nearest hospital. Otherwise, call your doctor.  Reactions should be reported to the Vaccine Adverse Event Reporting System (VAERS). Your doctor should file this report, or you can do it yourself through the  VAERS web site at www.vaers.hhs.gov, or by calling 1-800-822-7967. VAERS does not give medical advice. 6. The National Vaccine Injury Compensation Program The National Vaccine Injury Compensation Program (VICP) is a federal program that was created to compensate people who may have been injured by certain vaccines. Persons who believe they may have been injured by a vaccine can learn about the program and about filing a claim by calling 1-800-338-2382 or visiting the VICP website at www.hrsa.gov/vaccinecompensation. There is a time limit to file a claim for compensation. 7. How can I learn more?  Ask your healthcare provider. He or she can give you the vaccine package insert or suggest other sources of information.  Call your local or state health department.  Contact the Centers for Disease Control and Prevention (CDC):  Call 1-800-232-4636 (1-800-CDC-INFO) or  Visit CDC's website at www.cdc.gov/flu Vaccine Information Statement Inactivated Influenza Vaccine (02/01/2014)   This information is not intended to replace advice given to you by your health care provider. Make sure you discuss any questions you have with   your health care provider.   Document Released: 04/08/2006 Document Revised: 07/05/2014 Document Reviewed: 02/04/2014 Elsevier Interactive Patient Education 2016 Else[employer] Inc. Shingles Vaccine: What You Need to Know WHAT IS SHINGLES?  Shingles is a painful skin rash, often with blisters. It is also called Herpes Zoster or just Zoster.  A shingles rash usually appears on one side of the face or body and lasts from 2 to 4 weeks. Its main symptom is pain, which can be quite severe. Other symptoms of shingles can include fever, headache, chills, and upset stomach. Very rarely, a shingles infection can lead to pneumonia, hearing problems, blindness, brain inflammation (encephalitis), or death.  For about 1 person in 5, severe pain can continue even after the rash clears up. This  is called post-herpetic neuralgia.  Shingles is caused by the Varicella Zoster virus. This is the same virus that causes chickenpox. Only someone who has had a case of chickenpox or rarely, has gotten chickenpox vaccine, can get shingles. The virus stays in your body. It can reappear many years later to cause a case of shingles.  You cannot catch shingles from another person with shingles. However, a person who has never had chickenpox (or chickenpox vaccine) could get chickenpox from someone with shingles. This is not very common.  Shingles is far more common in people 41 and older than in younger people. It is also more common in people whose immune systems are weakened because of a disease such as cancer or drugs such as steroids or chemotherapy.  At least 1 million people get shingles per year in the [place]. SHINGLES VACCINE  A vaccine for shingles was licensed in 123456. In clinical trials, the vaccine reduced the risk of shingles by 50%. It can also reduce the pain in people who still get shingles after being vaccinated.  A single dose of shingles vaccine is recommended for adults 79 years of age and older. SOME PEOPLE SHOULD NOT GET SHINGLES VACCINE OR SHOULD WAIT A person should not get shingles vaccine if he or she:  Has ever had a life-threatening allergic reaction to gelatin, the antibiotic neomycin, or any other component of shingles vaccine. Tell your caregiver if you have any severe allergies.  Has a weakened immune system because of current:  AIDS or another disease that affects the immune system.  Treatment with drugs that affect the immune system, such as prolonged use of high-dose steroids.  Cancer treatment, such as radiation or chemotherapy.  Cancer affecting the bone marrow or lymphatic system, such as leukemia or lymphoma.  Is pregnant, or might be pregnant. Women should not become pregnant until at least 4 weeks after getting shingles vaccine. Someone with a  minor illness, such as a cold, may be vaccinated. Anyone with a moderate or severe acute illness should usually wait until he or she recovers before getting the vaccine. This includes anyone with a temperature of 101.3 F (38 C) or higher. WHAT ARE THE RISKS FROM SHINGLES VACCINE?  A vaccine, like any medicine, could possibly cause serious problems, such as severe allergic reactions. However, the risk of a vaccine causing serious harm, or death, is extremely small.  No serious problems have been identified with shingles vaccine. Mild Problems  Redness, soreness, swelling, or itching at the site of the injection (about 1 person in 3).  Headache (about 1 person in 49). Like all vaccines, shingles vaccine is being closely monitored for unusual or severe problems. WHAT IF THERE IS A MODERATE OR SEVERE REACTION?  What should I look for? Any unusual condition, such as a severe allergic reaction or a high fever. If a severe allergic reaction occurred, it would be within a few minutes to an hour after the shot. Signs of a serious allergic reaction can include difficulty breathing, weakness, hoarseness or wheezing, a fast heartbeat, hives, dizziness, paleness, or swelling of the throat. What should I do?  Call your caregiver, or get the person to a caregiver right away.  Tell the caregiver what happened, the date and time it happened, and when the vaccination was given.  Ask the caregiver to report the reaction by filing a Vaccine Adverse Event Reporting System (VAERS) form. Or, you can file this report through the VAERS web site at www.vaers.SamedayNews.es or by calling 540-328-6595. VAERS does not provide medical advice. HOW CAN I LEARN MORE?  Ask your caregiver. He or she can give you the vaccine package insert or suggest other sources of information.  Contact the Centers for Disease Control and Prevention (CDC):  Call 404 533 3104 (1-800-CDC-INFO).  Visit the CDC website at  http://hunter.com/ CDC Shingles Vaccine VIS (04/02/08)   This information is not intended to replace advice given to you by your health care provider. Make sure you discuss any questions you have with your health care provider.   Document Released: 04/11/2006 Document Revised: 10/29/2014 Document Reviewed: 10/04/2012 Elsevier Interactive Patient Education [employer].

## 2016-03-17 NOTE — Assessment & Plan Note (Signed)
DC Lisinopril and start Amlodipine 5 mg

## 2016-04-19 ENCOUNTER — Ambulatory Visit (INDEPENDENT_AMBULATORY_CARE_PROVIDER_SITE_OTHER): Payer: PRIVATE HEALTH INSURANCE | Admitting: Unknown Physician Specialty

## 2016-04-19 ENCOUNTER — Encounter: Payer: Self-pay | Admitting: Unknown Physician Specialty

## 2016-04-19 VITALS — BP 122/75 | HR 59 | Temp 97.8°F | Ht 69.5 in | Wt 181.2 lb

## 2016-04-19 DIAGNOSIS — Z5181 Encounter for therapeutic drug level monitoring: Secondary | ICD-10-CM

## 2016-04-19 DIAGNOSIS — I10 Essential (primary) hypertension: Secondary | ICD-10-CM | POA: Diagnosis not present

## 2016-04-19 DIAGNOSIS — E7800 Pure hypercholesterolemia, unspecified: Secondary | ICD-10-CM | POA: Diagnosis not present

## 2016-04-19 LAB — LIPID PANEL PICCOLO, WAIVED
Chol/HDL Ratio Piccolo,Waive: 2.7 mg/dL
Cholesterol Piccolo, Waived: 128 mg/dL (ref <200)
HDL Chol Piccolo, Waived: 48 mg/dL — ABNORMAL LOW (ref >59)
LDL Chol Calc Piccolo Waived: 57 mg/dL (ref <100)
Triglycerides Piccolo,Waived: 114 mg/dL (ref <150)
VLDL Chol Calc Piccolo,Waive: 23 mg/dL (ref <30)

## 2016-04-19 NOTE — Assessment & Plan Note (Signed)
Dramatic decreases in cholesterol from ldl of 197 to 57.  Continue present

## 2016-04-19 NOTE — Patient Instructions (Signed)
Cranial sacral massage therapy

## 2016-04-19 NOTE — Assessment & Plan Note (Signed)
Stable, continue present medications.   

## 2016-04-19 NOTE — Progress Notes (Signed)
BP 122/75 (BP Location: Left Arm, Patient Position: Sitting, Cuff Size: Large)   Pulse (!) 59   Temp 97.8 F (36.6 C)   Ht 5' 9.5" (1.765 m)   Wt 181 lb 3.2 oz (82.2 kg)   SpO2 98%   BMI 26.37 kg/m    Subjective:    Patient ID: Michael Zuniga, male    DOB: 1950-09-08, 65 y.o.   MRN: [id number]  HPI: DONYEL KRAHL is a 65 y.o. male  Chief Complaint  Patient presents with  . Hyperlipidemia  . Hypertension   Hypertension Using medications without difficulty Average home BPs   No problems or lightheadedness No chest pain with exertion or shortness of breath No Edema   Hyperlipidemia Using medications without problems: No Muscle aches  Diet compliance: Eating better Exercise: work  ED Would like a refill as it's helping.  80 mgs seems to work  Relevant past medical, surgical, family and social history reviewed and updated as indicated. Interim medical history since our last visit reviewed. Allergies and medications reviewed and updated.  Review of Systems  Per HPI unless specifically indicated above     Objective:    BP 122/75 (BP Location: Left Arm, Patient Position: Sitting, Cuff Size: Large)   Pulse (!) 59   Temp 97.8 F (36.6 C)   Ht 5' 9.5" (1.765 m)   Wt 181 lb 3.2 oz (82.2 kg)   SpO2 98%   BMI 26.37 kg/m   Wt Readings from Last 3 Encounters:  04/19/16 181 lb 3.2 oz (82.2 kg)  03/17/16 182 lb 6.4 oz (82.7 kg)  03/02/16 180 lb (81.6 kg)    Physical Exam  Constitutional: He is oriented to person, place, and time. He appears well-developed and well-nourished. No distress.  HENT:  Head: Normocephalic and atraumatic.  Eyes: Conjunctivae and lids are normal. Right eye exhibits no discharge. Left eye exhibits no discharge. No scleral icterus.  Neck: Normal range of motion. Neck supple. No JVD present. Carotid bruit is not present.  Cardiovascular: Normal rate, regular rhythm and normal heart sounds.   Pulmonary/Chest: Effort normal and breath sounds  normal. No respiratory distress.  Abdominal: Normal appearance. There is no splenomegaly or hepatomegaly.  Musculoskeletal: Normal range of motion.  Neurological: He is alert and oriented to person, place, and time.  Skin: Skin is warm, dry and intact. No rash noted. No pallor.  Psychiatric: He has a normal mood and affect. His behavior is normal. Judgment and thought content normal.    Results for orders placed or performed in visit on 02/16/16  Hepatitis C antibody  Result Value Ref Range   Hep C Virus Ab <0.1 0.0 - 0.9 s/co ratio  Comprehensive metabolic panel  Result Value Ref Range   Glucose 100 (H) 65 - 99 mg/dL   BUN 19 8 - 27 mg/dL   Creatinine, Ser 1.02 0.76 - 1.27 mg/dL   GFR calc non Af Amer 76 >59 mL/min/1.73   GFR calc Af Amer 88 >59 mL/min/1.73   BUN/Creatinine Ratio 19 10 - 24   Sodium 141 134 - 144 mmol/L   Potassium 4.7 3.5 - 5.2 mmol/L   Chloride 101 96 - 106 mmol/L   CO2 23 18 - 29 mmol/L   Calcium 9.9 8.6 - 10.2 mg/dL   Total Protein 7.5 6.0 - 8.5 g/dL   Albumin 4.8 3.6 - 4.8 g/dL   Globulin, Total 2.7 1.5 - 4.5 g/dL   Albumin/Globulin Ratio 1.8 1.2 - 2.2  Bilirubin Total 0.6 0.0 - 1.2 mg/dL   Alkaline Phosphatase 73 39 - 117 IU/L   AST 23 0 - 40 IU/L   ALT 24 0 - 44 IU/L  Lipid Panel w/o Chol/HDL Ratio  Result Value Ref Range   Cholesterol, Total 289 (H) 100 - 199 mg/dL   Triglycerides 174 (H) 0 - 149 mg/dL   HDL 57 >39 mg/dL   VLDL Cholesterol Cal 35 5 - 40 mg/dL   LDL Calculated 197 (H) 0 - 99 mg/dL  TSH  Result Value Ref Range   TSH 1.280 0.450 - 4.500 uIU/mL  CBC with Differential/Platelet  Result Value Ref Range   WBC 6.8 3.4 - 10.8 x10E3/uL   RBC 4.93 4.14 - 5.80 x10E6/uL   Hemoglobin 16.0 12.6 - 17.7 g/dL   Hematocrit 47.9 37.5 - 51.0 %   MCV 97 79 - 97 fL   MCH 32.5 26.6 - 33.0 pg   MCHC 33.4 31.5 - 35.7 g/dL   RDW 14.3 12.3 - 15.4 %   Platelets 268 150 - 379 x10E3/uL   Neutrophils 67 %   Lymphs 21 %   Monocytes 10 %   Eos 2 %    Basos 0 %   Neutrophils Absolute 4.6 1.4 - 7.0 x10E3/uL   Lymphocytes Absolute 1.4 0.7 - 3.1 x10E3/uL   Monocytes Absolute 0.7 0.1 - 0.9 x10E3/uL   EOS (ABSOLUTE) 0.1 0.0 - 0.4 x10E3/uL   Basophils Absolute 0.0 0.0 - 0.2 x10E3/uL   Immature Granulocytes 0 %   Immature Grans (Abs) 0.0 0.0 - 0.1 x10E3/uL      Assessment & Plan:   Problem List Items Addressed This Visit      Unprioritized   Hyperlipidemia - Primary    Dramatic decreases in cholesterol from ldl of 197 to 57.  Continue present      Relevant Orders   Comprehensive metabolic panel   Lipid Panel Piccolo, Waived   Hypertension    Stable, continue present medications.         Other Visit Diagnoses    Medication monitoring encounter       Relevant Orders   Comprehensive metabolic panel   Lipid Panel Piccolo, Waived       Follow up plan: Return in about 6 months (around 10/18/2016) for physical.

## 2016-04-20 ENCOUNTER — Encounter: Payer: Self-pay | Admitting: Unknown Physician Specialty

## 2016-04-20 ENCOUNTER — Other Ambulatory Visit: Payer: Self-pay

## 2016-04-20 LAB — COMPREHENSIVE METABOLIC PANEL
ALT: 23 IU/L (ref 0–44)
AST: 21 IU/L (ref 0–40)
Albumin/Globulin Ratio: 1.9 (ref 1.2–2.2)
Albumin: 4.5 g/dL (ref 3.6–4.8)
Alkaline Phosphatase: 67 IU/L (ref 39–117)
BUN/Creatinine Ratio: 12 (ref 10–24)
BUN: 12 mg/dL (ref 8–27)
Bilirubin Total: 0.4 mg/dL (ref 0.0–1.2)
CO2: 23 mmol/L (ref 18–29)
Calcium: 9.7 mg/dL (ref 8.6–10.2)
Chloride: 105 mmol/L (ref 96–106)
Creatinine, Ser: 1.03 mg/dL (ref 0.76–1.27)
GFR calc Af Amer: 86 mL/min/{1.73_m2} (ref >59)
GFR calc non Af Amer: 75 mL/min/{1.73_m2} (ref >59)
Globulin, Total: 2.4 g/dL (ref 1.5–4.5)
Glucose: 94 mg/dL (ref 65–99)
Potassium: 4.5 mmol/L (ref 3.5–5.2)
Sodium: 142 mmol/L (ref 134–144)
Total Protein: 6.9 g/dL (ref 6.0–8.5)

## 2016-04-20 NOTE — Progress Notes (Signed)
Normal labs.  Patient notified by letter.

## 2016-04-21 MED ORDER — SILDENAFIL CITRATE 20 MG PO TABS: ORAL_TABLET | ORAL | 0 refills | Status: DC

## 2016-06-17 ENCOUNTER — Other Ambulatory Visit: Payer: Self-pay

## 2016-06-17 NOTE — Telephone Encounter (Addendum)
Request for Amlodipine 5mg  tablet and Sildenafil 20mg  tablet.   Last visit 04/19/2016

## 2016-06-18 ENCOUNTER — Other Ambulatory Visit: Payer: Self-pay

## 2016-06-18 MED ORDER — SILDENAFIL CITRATE 20 MG PO TABS: ORAL_TABLET | ORAL | 0 refills | Status: DC

## 2016-06-18 MED ORDER — AMLODIPINE BESYLATE 5 MG PO TABS: 5.0000 mg | ORAL_TABLET | Freq: Every day | ORAL | 0 refills | Status: DC

## 2016-08-26 ENCOUNTER — Encounter: Payer: Self-pay | Admitting: Family Medicine

## 2016-08-26 ENCOUNTER — Ambulatory Visit (INDEPENDENT_AMBULATORY_CARE_PROVIDER_SITE_OTHER): Payer: PRIVATE HEALTH INSURANCE | Admitting: Family Medicine

## 2016-08-26 VITALS — BP 103/65 | HR 65 | Temp 97.7°F | Wt 177.0 lb

## 2016-08-26 DIAGNOSIS — M778 Other enthesopathies, not elsewhere classified: Secondary | ICD-10-CM | POA: Diagnosis not present

## 2016-08-26 MED ORDER — TRAMADOL HCL 50 MG PO TABS: 50.0000 mg | ORAL_TABLET | Freq: Three times a day (TID) | ORAL | 0 refills | Status: DC | PRN

## 2016-08-26 MED ORDER — DICLOFENAC SODIUM 1 % TD GEL: 4.0000 g | Freq: Four times a day (QID) | TRANSDERMAL | 3 refills | Status: DC

## 2016-08-26 MED ORDER — PREDNISONE 20 MG PO TABS: 40.0000 mg | ORAL_TABLET | Freq: Every day | ORAL | 0 refills | Status: DC

## 2016-08-26 MED ORDER — ATORVASTATIN CALCIUM 20 MG PO TABS: 20.0000 mg | ORAL_TABLET | Freq: Every day | ORAL | 0 refills | Status: DC

## 2016-08-26 MED ORDER — CYCLOBENZAPRINE HCL 10 MG PO TABS: 10.0000 mg | ORAL_TABLET | Freq: Three times a day (TID) | ORAL | 0 refills | Status: DC | PRN

## 2016-08-26 NOTE — Patient Instructions (Addendum)
Warm Epsom salt baths when you get home from work. Massage your wrist. Apply a compression sleeve to your wrist when you're working for extra support.

## 2016-08-26 NOTE — Progress Notes (Signed)
   BP 103/65   Pulse 65   Temp 97.7 F (36.5 C)   Wt 177 lb (80.3 kg)   SpO2 99%   BMI 25.76 kg/m    Subjective:    Patient ID: Michael Zuniga, male    DOB: [birth year], 66 y.o.   MRN: [id number]  HPI: KRISTIAN BATDORF is a 66 y.o. male  Chief Complaint  Patient presents with  . Hand Pain    left thumb, hand, wrist and up to his elbow. Bothers him often, but worse x 1 day.    Left hand pain Patient presents to the clinic today complaining of left thumb pain that radiates to left wrist and elbow that has worsened over the past day. 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 Took 4 Naproxen this morning without relief. No trauma or history of hand injury. He is left hand dominant. He has splinted it in the past and that worsened the pain. He has had similar episodes in the past and has tried massaging it that has worked before but did not work now.    Relevant past medical, surgical, family and social history reviewed and updated as indicated. Interim medical history since our last visit reviewed. Allergies and medications reviewed and updated.  Review of Systems  Per HPI unless specifically indicated above     Objective:    BP 103/65   Pulse 65   Temp 97.7 F (36.5 C)   Wt 177 lb (80.3 kg)   SpO2 99%   BMI 25.76 kg/m   Wt Readings from Last 3 Encounters:  08/26/16 177 lb (80.3 kg)  04/19/16 181 lb 3.2 oz (82.2 kg)  03/17/16 182 lb 6.4 oz (82.7 kg)    Physical Exam  Constitutional: He is oriented to person, place, and time. He appears well-developed and well-nourished.  HENT:  Head: Normocephalic and atraumatic.  Eyes: Conjunctivae are normal. Right eye exhibits no discharge. Left eye exhibits no discharge.  Neck: Normal range of motion. Neck supple.  Cardiovascular: Normal rate, regular rhythm, normal heart sounds and intact distal pulses.   Pulmonary/Chest: Effort normal and breath sounds normal.    Abdominal: Soft. Bowel sounds are normal.  Musculoskeletal: Normal range of motion.  Left elbow: Flexion and extension intact. No tenderness to medial or lateral epicondyles. Left thumb and wrist: Full flexion and extension intact. Limited strength 3/5 due to pain. No diminished sensation and is neurovascularly intact. Strong radial pulse. No snuffbox tenderness.  Neurological: He is alert and oriented to person, place, and time.  Skin: Skin is warm and dry.  Psychiatric: He has a normal mood and affect. His behavior is normal. Judgment and thought content normal.      Assessment & Plan:   Problem List Items Addressed This Visit    None    Visit Diagnoses    Wrist tendonitis    -  Primary   Discussed prednisone, voltaren gel, and tramadol prn for pain. Compression brace, rest. Avoid strenuous activities at work as much as possible.        Follow up plan: Return if symptoms worsen or fail to improve.

## 2016-10-18 ENCOUNTER — Encounter: Payer: Self-pay | Admitting: Unknown Physician Specialty

## 2016-10-18 ENCOUNTER — Ambulatory Visit (INDEPENDENT_AMBULATORY_CARE_PROVIDER_SITE_OTHER): Payer: PRIVATE HEALTH INSURANCE | Admitting: Unknown Physician Specialty

## 2016-10-18 VITALS — BP 160/82 | HR 66 | Temp 98.2°F | Ht 67.5 in | Wt 177.2 lb

## 2016-10-18 DIAGNOSIS — I10 Essential (primary) hypertension: Secondary | ICD-10-CM

## 2016-10-18 DIAGNOSIS — I2510 Atherosclerotic heart disease of native coronary artery without angina pectoris: Secondary | ICD-10-CM | POA: Diagnosis not present

## 2016-10-18 DIAGNOSIS — E7800 Pure hypercholesterolemia, unspecified: Secondary | ICD-10-CM | POA: Diagnosis not present

## 2016-10-18 DIAGNOSIS — Z0000 Encounter for general adult medical examination without abnormal findings: Secondary | ICD-10-CM

## 2016-10-18 MED ORDER — AMLODIPINE BESYLATE 5 MG PO TABS: 5.0000 mg | ORAL_TABLET | Freq: Every day | ORAL | 0 refills | Status: DC

## 2016-10-18 MED ORDER — ATORVASTATIN CALCIUM 20 MG PO TABS: 20.0000 mg | ORAL_TABLET | Freq: Every day | ORAL | 0 refills | Status: DC

## 2016-10-18 MED ORDER — SILDENAFIL CITRATE 20 MG PO TABS: ORAL_TABLET | ORAL | 0 refills | Status: DC

## 2016-10-18 NOTE — Assessment & Plan Note (Signed)
Elevated BP today.  Refusing additional medicine.  Wants to check at home.  I will recheck in 1 months

## 2016-10-18 NOTE — Assessment & Plan Note (Signed)
Check Lipid panel 

## 2016-10-18 NOTE — Progress Notes (Signed)
BP (!) 160/82 (BP Location: Left Arm, Cuff Size: Normal)   Pulse 66   Temp 98.2 F (36.8 C)   Ht 5' 7.5" (1.715 m)   Wt 177 lb 3.2 oz (80.4 kg)   SpO2 98%   BMI 27.34 kg/m    Subjective:    Patient ID: Michael Zuniga, male    DOB: January 02, 1951, 66 y.o.   MRN: [id number]  HPI: Michael Zuniga is a 66 y.o. male  Chief Complaint  Patient presents with  . Annual Exam   Pt is here for general physical  Hypertension Using medications without difficulty Average home BPs                  No problems or lightheadedness No chest pain with exertion or shortness of breath No Edema  Hyperlipidemia Using medications without problems: No Muscle aches  Diet compliance: Tries to eat well Exercise: through work.  States he is always busy  ED Would like a refill of Sildenafil as it's helping.  80 mgs seems to work  Relevant past medical, surgical, family and social history reviewed and updated as indicated. Interim medical history since our last visit reviewed. Allergies and medications reviewed and updated.  Review of Systems  Constitutional: Negative.   HENT: Negative.   Respiratory: Negative.   Cardiovascular: Negative.   Genitourinary: Negative.   Musculoskeletal: Positive for back pain.       Trouble with sciatica that seems to come and go  Neurological: Negative.   Hematological: Negative.   Psychiatric/Behavioral: Negative.     Per HPI unless specifically indicated above     Objective:    BP (!) 160/82 (BP Location: Left Arm, Cuff Size: Normal)   Pulse 66   Temp 98.2 F (36.8 C)   Ht 5' 7.5" (1.715 m)   Wt 177 lb 3.2 oz (80.4 kg)   SpO2 98%   BMI 27.34 kg/m   Wt Readings from Last 3 Encounters:  10/18/16 177 lb 3.2 oz (80.4 kg)  08/26/16 177 lb (80.3 kg)  04/19/16 181 lb 3.2 oz (82.2 kg)    Physical Exam  Constitutional: He is oriented to person, place, and time. He appears well-developed and well-nourished.  HENT:  Head: Normocephalic.  Right Ear:  Tympanic membrane, external ear and ear canal normal.  Left Ear: Tympanic membrane, external ear and ear canal normal.  Mouth/Throat: Uvula is midline, oropharynx is clear and moist and mucous membranes are normal.  Eyes: Pupils are equal, round, and reactive to light.  Cardiovascular: Normal rate, regular rhythm and normal heart sounds.  Exam reveals no gallop and no friction rub.   No murmur heard. Pulmonary/Chest: Effort normal and breath sounds normal. No respiratory distress.  Abdominal: Soft. Bowel sounds are normal. He exhibits no distension. There is no tenderness.  Genitourinary: Rectum normal and prostate normal.  Musculoskeletal: Normal range of motion.  Neurological: He is alert and oriented to person, place, and time. He has normal reflexes.  Skin: Skin is warm and dry.  Psychiatric: He has a normal mood and affect. His behavior is normal. Judgment and thought content normal.    Results for orders placed or performed in visit on 04/19/16  Comprehensive metabolic panel  Result Value Ref Range   Glucose 94 65 - 99 mg/dL   BUN 12 8 - 27 mg/dL   Creatinine, Ser 1.03 0.76 - 1.27 mg/dL   GFR calc non Af Amer 75 >59 mL/min/1.73   GFR calc Af Wyvonnia Lora  86 >59 mL/min/1.73   BUN/Creatinine Ratio 12 10 - 24   Sodium 142 134 - 144 mmol/L   Potassium 4.5 3.5 - 5.2 mmol/L   Chloride 105 96 - 106 mmol/L   CO2 23 18 - 29 mmol/L   Calcium 9.7 8.6 - 10.2 mg/dL   Total Protein 6.9 6.0 - 8.5 g/dL   Albumin 4.5 3.6 - 4.8 g/dL   Globulin, Total 2.4 1.5 - 4.5 g/dL   Albumin/Globulin Ratio 1.9 1.2 - 2.2   Bilirubin Total 0.4 0.0 - 1.2 mg/dL   Alkaline Phosphatase 67 39 - 117 IU/L   AST 21 0 - 40 IU/L   ALT 23 0 - 44 IU/L  Lipid Panel Piccolo, Waived  Result Value Ref Range   Cholesterol Piccolo, Waived 128 <200 mg/dL   HDL Chol Piccolo, Waived 48 (L) >59 mg/dL   Triglycerides Piccolo,Waived 114 <150 mg/dL   Chol/HDL Ratio Piccolo,Waive 2.7 mg/dL   LDL Chol Calc Piccolo Waived 57 <100 mg/dL    VLDL Chol Calc Piccolo,Waive 23 <30 mg/dL      Assessment & Plan:   Problem List Items Addressed This Visit      Unprioritized   CAD (coronary artery disease) - Primary   Relevant Medications   amLODipine (NORVASC) 5 MG tablet   sildenafil (REVATIO) 20 MG tablet   atorvastatin (LIPITOR) 20 MG tablet   Other Relevant Orders   Lipid Panel w/o Chol/HDL Ratio   Hyperlipidemia    Check Lipid panel      Relevant Medications   amLODipine (NORVASC) 5 MG tablet   sildenafil (REVATIO) 20 MG tablet   atorvastatin (LIPITOR) 20 MG tablet   Other Relevant Orders   Lipid Panel w/o Chol/HDL Ratio   Hypertension    Elevated BP today.  Refusing additional medicine.  Wants to check at home.  I will recheck in 1 months      Relevant Medications   amLODipine (NORVASC) 5 MG tablet   sildenafil (REVATIO) 20 MG tablet   atorvastatin (LIPITOR) 20 MG tablet   Other Relevant Orders   Comprehensive metabolic panel    Other Visit Diagnoses    Annual physical exam       Relevant Orders   Comprehensive metabolic panel   Lipid Panel w/o Chol/HDL Ratio   PSA   CBC with Differential/Platelet   TSH       Follow up plan: Return in about 4 weeks (around 11/15/2016). To check BP

## 2016-10-19 ENCOUNTER — Encounter: Payer: Self-pay | Admitting: Unknown Physician Specialty

## 2016-10-19 LAB — COMPREHENSIVE METABOLIC PANEL
ALT: 22 IU/L (ref 0–44)
AST: 23 IU/L (ref 0–40)
Albumin/Globulin Ratio: 1.9 (ref 1.2–2.2)
Albumin: 4.6 g/dL (ref 3.6–4.8)
Alkaline Phosphatase: 73 IU/L (ref 39–117)
BUN/Creatinine Ratio: 18 (ref 10–24)
BUN: 14 mg/dL (ref 8–27)
Bilirubin Total: 0.4 mg/dL (ref 0.0–1.2)
CO2: 24 mmol/L (ref 18–29)
Calcium: 9.8 mg/dL (ref 8.6–10.2)
Chloride: 100 mmol/L (ref 96–106)
Creatinine, Ser: 0.76 mg/dL (ref 0.76–1.27)
GFR calc Af Amer: 108 mL/min/{1.73_m2} (ref >59)
GFR calc non Af Amer: 94 mL/min/{1.73_m2} (ref >59)
Globulin, Total: 2.4 g/dL (ref 1.5–4.5)
Glucose: 104 mg/dL — ABNORMAL HIGH (ref 65–99)
Potassium: 4.1 mmol/L (ref 3.5–5.2)
Sodium: 141 mmol/L (ref 134–144)
Total Protein: 7 g/dL (ref 6.0–8.5)

## 2016-10-19 LAB — TSH: TSH: 1.46 u[IU]/mL (ref 0.450–4.500)

## 2016-10-19 LAB — CBC WITH DIFFERENTIAL/PLATELET
Basophils Absolute: 0 10*3/uL (ref 0.0–0.2)
Basos: 0 %
EOS (ABSOLUTE): 0.1 10*3/uL (ref 0.0–0.4)
Eos: 1 %
Hematocrit: 41.7 % (ref 37.5–51.0)
Hemoglobin: 14.2 g/dL (ref 13.0–17.7)
Immature Grans (Abs): 0 10*3/uL (ref 0.0–0.1)
Immature Granulocytes: 0 %
Lymphocytes Absolute: 1.2 10*3/uL (ref 0.7–3.1)
Lymphs: 24 %
MCH: 31.8 pg (ref 26.6–33.0)
MCHC: 34.1 g/dL (ref 31.5–35.7)
MCV: 93 fL (ref 79–97)
Monocytes Absolute: 0.4 10*3/uL (ref 0.1–0.9)
Monocytes: 8 %
Neutrophils Absolute: 3.2 10*3/uL (ref 1.4–7.0)
Neutrophils: 67 %
Platelets: 255 10*3/uL (ref 150–379)
RBC: 4.47 x10E6/uL (ref 4.14–5.80)
RDW: 13.3 % (ref 12.3–15.4)
WBC: 4.8 10*3/uL (ref 3.4–10.8)

## 2016-10-19 LAB — LIPID PANEL W/O CHOL/HDL RATIO
Cholesterol, Total: 160 mg/dL (ref 100–199)
HDL: 56 mg/dL (ref >39)
LDL Calculated: 91 mg/dL (ref 0–99)
Triglycerides: 67 mg/dL (ref 0–149)
VLDL Cholesterol Cal: 13 mg/dL (ref 5–40)

## 2016-10-19 LAB — PSA: Prostate Specific Ag, Serum: 0.5 ng/mL (ref 0.0–4.0)

## 2016-11-24 ENCOUNTER — Ambulatory Visit: Payer: PRIVATE HEALTH INSURANCE | Admitting: Unknown Physician Specialty

## 2016-12-01 ENCOUNTER — Encounter: Payer: Self-pay | Admitting: Unknown Physician Specialty

## 2016-12-01 ENCOUNTER — Ambulatory Visit (INDEPENDENT_AMBULATORY_CARE_PROVIDER_SITE_OTHER): Payer: PRIVATE HEALTH INSURANCE | Admitting: Unknown Physician Specialty

## 2016-12-01 DIAGNOSIS — I10 Essential (primary) hypertension: Secondary | ICD-10-CM

## 2016-12-01 DIAGNOSIS — N529 Male erectile dysfunction, unspecified: Secondary | ICD-10-CM | POA: Insufficient documentation

## 2016-12-01 MED ORDER — SILDENAFIL CITRATE 20 MG PO TABS: ORAL_TABLET | ORAL | 2 refills | Status: DC

## 2016-12-01 NOTE — Progress Notes (Signed)
BP 136/70   Pulse (!) 58   Temp 98.3 F (36.8 C)   Wt 178 lb (80.7 kg)   SpO2 98%   BMI 27.47 kg/m    Subjective:    Patient ID: Michael Zuniga, male    DOB: 20-Oct-1950, 66 y.o.   MRN: [id number]  HPI: Michael Zuniga is a 66 y.o. male  Chief Complaint  Patient presents with  . Hypertension    4 week f/up   Hypertension Using medications without difficulty Average home BPs not checking   No problems or lightheadedness No chest pain with exertion or shortness of breath No Edema  ED Would like refills of Viagra  Relevant past medical, surgical, family and social history reviewed and updated as indicated. Interim medical history since our last visit reviewed. Allergies and medications reviewed and updated.  Review of Systems  Per HPI unless specifically indicated above     Objective:    BP 136/70   Pulse (!) 58   Temp 98.3 F (36.8 C)   Wt 178 lb (80.7 kg)   SpO2 98%   BMI 27.47 kg/m   Wt Readings from Last 3 Encounters:  12/01/16 178 lb (80.7 kg)  10/18/16 177 lb 3.2 oz (80.4 kg)  08/26/16 177 lb (80.3 kg)    Physical Exam  Constitutional: He is oriented to person, place, and time. He appears well-developed and well-nourished. No distress.  HENT:  Head: Normocephalic and atraumatic.  Eyes: Conjunctivae and lids are normal. Right eye exhibits no discharge. Left eye exhibits no discharge. No scleral icterus.  Neck: Normal range of motion. Neck supple. No JVD present. Carotid bruit is not present.  Cardiovascular: Normal rate, regular rhythm and normal heart sounds.   Pulmonary/Chest: Effort normal and breath sounds normal. No respiratory distress.  Abdominal: Normal appearance. There is no splenomegaly or hepatomegaly.  Musculoskeletal: Normal range of motion.  Neurological: He is alert and oriented to person, place, and time.  Skin: Skin is warm, dry and intact. No rash noted. No pallor.  Psychiatric: He has a normal mood and affect. His behavior is  normal. Judgment and thought content normal.    Results for orders placed or performed in visit on 10/18/16  Comprehensive metabolic panel  Result Value Ref Range   Glucose 104 (H) 65 - 99 mg/dL   BUN 14 8 - 27 mg/dL   Creatinine, Ser 0.76 0.76 - 1.27 mg/dL   GFR calc non Af Amer 94 >59 mL/min/1.73   GFR calc Af Amer 108 >59 mL/min/1.73   BUN/Creatinine Ratio 18 10 - 24   Sodium 141 134 - 144 mmol/L   Potassium 4.1 3.5 - 5.2 mmol/L   Chloride 100 96 - 106 mmol/L   CO2 24 18 - 29 mmol/L   Calcium 9.8 8.6 - 10.2 mg/dL   Total Protein 7.0 6.0 - 8.5 g/dL   Albumin 4.6 3.6 - 4.8 g/dL   Globulin, Total 2.4 1.5 - 4.5 g/dL   Albumin/Globulin Ratio 1.9 1.2 - 2.2   Bilirubin Total 0.4 0.0 - 1.2 mg/dL   Alkaline Phosphatase 73 39 - 117 IU/L   AST 23 0 - 40 IU/L   ALT 22 0 - 44 IU/L  Lipid Panel w/o Chol/HDL Ratio  Result Value Ref Range   Cholesterol, Total 160 100 - 199 mg/dL   Triglycerides 67 0 - 149 mg/dL   HDL 56 >39 mg/dL   VLDL Cholesterol Cal 13 5 - 40 mg/dL   LDL Calculated  91 0 - 99 mg/dL  PSA  Result Value Ref Range   Prostate Specific Ag, Serum 0.5 0.0 - 4.0 ng/mL  CBC with Differential/Platelet  Result Value Ref Range   WBC 4.8 3.4 - 10.8 x10E3/uL   RBC 4.47 4.14 - 5.80 x10E6/uL   Hemoglobin 14.2 13.0 - 17.7 g/dL   Hematocrit 41.7 37.5 - 51.0 %   MCV 93 79 - 97 fL   MCH 31.8 26.6 - 33.0 pg   MCHC 34.1 31.5 - 35.7 g/dL   RDW 13.3 12.3 - 15.4 %   Platelets 255 150 - 379 x10E3/uL   Neutrophils 67 Not Estab. %   Lymphs 24 Not Estab. %   Monocytes 8 Not Estab. %   Eos 1 Not Estab. %   Basos 0 Not Estab. %   Neutrophils Absolute 3.2 1.4 - 7.0 x10E3/uL   Lymphocytes Absolute 1.2 0.7 - 3.1 x10E3/uL   Monocytes Absolute 0.4 0.1 - 0.9 x10E3/uL   EOS (ABSOLUTE) 0.1 0.0 - 0.4 x10E3/uL   Basophils Absolute 0.0 0.0 - 0.2 x10E3/uL   Immature Granulocytes 0 Not Estab. %   Immature Grans (Abs) 0.0 0.0 - 0.1 x10E3/uL  TSH  Result Value Ref Range   TSH 1.460 0.450 - 4.500  uIU/mL      Assessment & Plan:   Problem List Items Addressed This Visit      Unprioritized   ED (erectile dysfunction)    Refill Sildenafil      Hypertension    On recheck, BP 136/70.  Continue present medications.        Relevant Medications   sildenafil (REVATIO) 20 MG tablet       Follow up plan: Return in about 6 months (around 06/02/2017).

## 2016-12-01 NOTE — Assessment & Plan Note (Signed)
Refill Sildenafil  

## 2016-12-01 NOTE — Assessment & Plan Note (Signed)
On recheck, BP 136/70.  Continue present medications.

## 2017-03-10 ENCOUNTER — Other Ambulatory Visit: Payer: Self-pay

## 2017-03-10 MED ORDER — ATORVASTATIN CALCIUM 20 MG PO TABS
20.0000 mg | ORAL_TABLET | Freq: Every day | ORAL | 0 refills | Status: DC
Start: 2017-03-10 — End: 2017-06-03

## 2017-03-10 MED ORDER — AMLODIPINE BESYLATE 5 MG PO TABS: 5.0000 mg | ORAL_TABLET | Freq: Every day | ORAL | 0 refills | Status: DC

## 2017-03-10 NOTE — Telephone Encounter (Signed)
Patient last seen 12/01/16 and has appointment 06/03/17.

## 2017-06-03 ENCOUNTER — Ambulatory Visit: Payer: PRIVATE HEALTH INSURANCE | Admitting: Unknown Physician Specialty

## 2017-06-03 ENCOUNTER — Encounter: Payer: Self-pay | Admitting: Unknown Physician Specialty

## 2017-06-03 VITALS — BP 161/93 | HR 56 | Temp 97.9°F | Wt 189.9 lb

## 2017-06-03 DIAGNOSIS — Z23 Encounter for immunization: Secondary | ICD-10-CM | POA: Diagnosis not present

## 2017-06-03 DIAGNOSIS — N529 Male erectile dysfunction, unspecified: Secondary | ICD-10-CM

## 2017-06-03 DIAGNOSIS — E7800 Pure hypercholesterolemia, unspecified: Secondary | ICD-10-CM

## 2017-06-03 DIAGNOSIS — I10 Essential (primary) hypertension: Secondary | ICD-10-CM | POA: Diagnosis not present

## 2017-06-03 MED ORDER — ATORVASTATIN CALCIUM 40 MG PO TABS: 40.0000 mg | ORAL_TABLET | Freq: Every day | ORAL | 3 refills | Status: DC

## 2017-06-03 MED ORDER — AMLODIPINE BESYLATE 10 MG PO TABS: 10.0000 mg | ORAL_TABLET | Freq: Every day | ORAL | 3 refills | Status: DC

## 2017-06-03 NOTE — Assessment & Plan Note (Signed)
Refusing referral for now.  Refill Sildenafil

## 2017-06-03 NOTE — Assessment & Plan Note (Signed)
Not to goal.  Increase Amlodipine to 10 mg

## 2017-06-03 NOTE — Assessment & Plan Note (Signed)
LDL was 91.  His goal is 70.  Increase Atorvastatin from 20 to 40 mg.

## 2017-06-03 NOTE — Progress Notes (Signed)
BP (!) 161/93 (BP Location: Left Arm, Cuff Size: Normal)   Pulse (!) 56   Temp 97.9 F (36.6 C) (Oral)   Wt 189 lb 14.4 oz (86.1 kg)   SpO2 96%   BMI 29.30 kg/m    Subjective:    Patient ID: Michael Zuniga, male    DOB: [birth year], 66 y.o.   MRN: [id number]  HPI: Michael Zuniga is a 66 y.o. male  Chief Complaint  Patient presents with  . Hyperlipidemia  . Hypertension   Hypertension Using medications without difficulty Average home BPs Lower than here - but not able to tell me what    No problems or lightheadedness No chest pain with exertion or shortness of breath No Edema  Hyperlipidemia Using medications without problems: No Muscle aches  Diet compliance:Exercise: Stays busy at work  ED Still not getting an ideal erection.  Stating he can't afford Urology at this point  Tendonitis Using CBD oil which seems to help  Insomnia Thinking of taking Melatonin  Relevant past medical, surgical, family and social history reviewed and updated as indicated. Interim medical history since our last visit reviewed. Allergies and medications reviewed and updated.  Review of Systems  Constitutional: Negative.   Respiratory: Negative.   Cardiovascular: Negative.   Musculoskeletal: Negative.   Psychiatric/Behavioral: Negative.     Per HPI unless specifically indicated above     Objective:    BP (!) 161/93 (BP Location: Left Arm, Cuff Size: Normal)   Pulse (!) 56   Temp 97.9 F (36.6 C) (Oral)   Wt 189 lb 14.4 oz (86.1 kg)   SpO2 96%   BMI 29.30 kg/m   Wt Readings from Last 3 Encounters:  06/03/17 189 lb 14.4 oz (86.1 kg)  12/01/16 178 lb (80.7 kg)  10/18/16 177 lb 3.2 oz (80.4 kg)    Physical Exam  Constitutional: He is oriented to person, place, and time. He appears well-developed and well-nourished. No distress.  HENT:  Head: Normocephalic and atraumatic.  Eyes: Conjunctivae and lids are normal. Right eye exhibits no discharge. Left eye exhibits no  discharge. No scleral icterus.  Neck: Normal range of motion. Neck supple. No JVD present. Carotid bruit is not present.  Cardiovascular: Normal rate, regular rhythm and normal heart sounds.  Pulmonary/Chest: Effort normal and breath sounds normal. No respiratory distress.  Abdominal: Normal appearance. There is no splenomegaly or hepatomegaly.  Musculoskeletal: Normal range of motion.  Neurological: He is alert and oriented to person, place, and time.  Skin: Skin is warm, dry and intact. No rash noted. No pallor.  Psychiatric: He has a normal mood and affect. His behavior is normal. Judgment and thought content normal.    Results for orders placed or performed in visit on 10/18/16  Comprehensive metabolic panel  Result Value Ref Range   Glucose 104 (H) 65 - 99 mg/dL   BUN 14 8 - 27 mg/dL   Creatinine, Ser 0.76 0.76 - 1.27 mg/dL   GFR calc non Af Amer 94 >59 mL/min/1.73   GFR calc Af Amer 108 >59 mL/min/1.73   BUN/Creatinine Ratio 18 10 - 24   Sodium 141 134 - 144 mmol/L   Potassium 4.1 3.5 - 5.2 mmol/L   Chloride 100 96 - 106 mmol/L   CO2 24 18 - 29 mmol/L   Calcium 9.8 8.6 - 10.2 mg/dL   Total Protein 7.0 6.0 - 8.5 g/dL   Albumin 4.6 3.6 - 4.8 g/dL   Globulin, Total 2.4 1.5 -  4.5 g/dL   Albumin/Globulin Ratio 1.9 1.2 - 2.2   Bilirubin Total 0.4 0.0 - 1.2 mg/dL   Alkaline Phosphatase 73 39 - 117 IU/L   AST 23 0 - 40 IU/L   ALT 22 0 - 44 IU/L  Lipid Panel w/o Chol/HDL Ratio  Result Value Ref Range   Cholesterol, Total 160 100 - 199 mg/dL   Triglycerides 67 0 - 149 mg/dL   HDL 56 >39 mg/dL   VLDL Cholesterol Cal 13 5 - 40 mg/dL   LDL Calculated 91 0 - 99 mg/dL  PSA  Result Value Ref Range   Prostate Specific Ag, Serum 0.5 0.0 - 4.0 ng/mL  CBC with Differential/Platelet  Result Value Ref Range   WBC 4.8 3.4 - 10.8 x10E3/uL   RBC 4.47 4.14 - 5.80 x10E6/uL   Hemoglobin 14.2 13.0 - 17.7 g/dL   Hematocrit 41.7 37.5 - 51.0 %   MCV 93 79 - 97 fL   MCH 31.8 26.6 - 33.0 pg    MCHC 34.1 31.5 - 35.7 g/dL   RDW 13.3 12.3 - 15.4 %   Platelets 255 150 - 379 x10E3/uL   Neutrophils 67 Not Estab. %   Lymphs 24 Not Estab. %   Monocytes 8 Not Estab. %   Eos 1 Not Estab. %   Basos 0 Not Estab. %   Neutrophils Absolute 3.2 1.4 - 7.0 x10E3/uL   Lymphocytes Absolute 1.2 0.7 - 3.1 x10E3/uL   Monocytes Absolute 0.4 0.1 - 0.9 x10E3/uL   EOS (ABSOLUTE) 0.1 0.0 - 0.4 x10E3/uL   Basophils Absolute 0.0 0.0 - 0.2 x10E3/uL   Immature Granulocytes 0 Not Estab. %   Immature Grans (Abs) 0.0 0.0 - 0.1 x10E3/uL  TSH  Result Value Ref Range   TSH 1.460 0.450 - 4.500 uIU/mL      Assessment & Plan:   Problem List Items Addressed This Visit      Unprioritized   ED (erectile dysfunction)    Refusing referral for now.  Refill Sildenafil      Hyperlipidemia    LDL was 91.  His goal is 70.  Increase Atorvastatin from 20 to 40 mg.        Relevant Medications   amLODipine (NORVASC) 10 MG tablet   atorvastatin (LIPITOR) 40 MG tablet   Hypertension    Not to goal.  Increase Amlodipine to 10 mg      Relevant Medications   amLODipine (NORVASC) 10 MG tablet   atorvastatin (LIPITOR) 40 MG tablet    Other Visit Diagnoses    Need for pneumococcal vaccination    -  Primary   Relevant Orders   Pneumococcal polysaccharide vaccine 23-valent greater than or equal to 2yo subcutaneous/IM (Completed)       Follow up plan: Return in about 4 weeks (around 07/01/2017).

## 2017-06-03 NOTE — Patient Instructions (Addendum)

## 2017-07-01 ENCOUNTER — Ambulatory Visit: Payer: PRIVATE HEALTH INSURANCE | Admitting: Unknown Physician Specialty

## 2017-07-01 ENCOUNTER — Encounter: Payer: Self-pay | Admitting: Unknown Physician Specialty

## 2017-07-01 DIAGNOSIS — N529 Male erectile dysfunction, unspecified: Secondary | ICD-10-CM

## 2017-07-01 DIAGNOSIS — I10 Essential (primary) hypertension: Secondary | ICD-10-CM | POA: Diagnosis not present

## 2017-07-01 MED ORDER — LOSARTAN POTASSIUM 50 MG PO TABS: 50.0000 mg | ORAL_TABLET | Freq: Every day | ORAL | 1 refills | Status: DC

## 2017-07-01 NOTE — Assessment & Plan Note (Signed)
On optimum therapy.  Tloerating well.  Continue present medications.

## 2017-07-01 NOTE — Assessment & Plan Note (Signed)
Not to goal.  Will add Losartan to present medications.

## 2017-07-01 NOTE — Progress Notes (Signed)
   BP (!) 148/83   Pulse 75   Temp 98.1 F (36.7 C) (Oral)   Wt 187 lb 12.8 oz (85.2 kg)   SpO2 97%   BMI 28.98 kg/m    Subjective:    Patient ID: Michael Zuniga, male    DOB: Jun 23, 1951, 67 y.o.   MRN: [id number]  HPI: Michael Zuniga is a 67 y.o. male  Chief Complaint  Patient presents with  . Hypertension    4 week f/up  . Hyperlipidemia    4 week f/up   Hypertension Using medications without difficulty Average home BPs Not checking  No problems or lightheadedness No chest pain with exertion or shortness of breath No Edema  Hyperlipidemia Increased Atorvastatin to 40 mg last visit Using medications without problems: No Muscle aches  Diet compliance:Exercise: Physical job.    Relevant past medical, surgical, family and social history reviewed and updated as indicated. Interim medical history since our last visit reviewed. Allergies and medications reviewed and updated.  Review of Systems  Constitutional: Negative.   Respiratory: Negative.   Cardiovascular: Negative.   Psychiatric/Behavioral: Negative.     Per HPI unless specifically indicated above     Objective:    BP (!) 148/83   Pulse 75   Temp 98.1 F (36.7 C) (Oral)   Wt 187 lb 12.8 oz (85.2 kg)   SpO2 97%   BMI 28.98 kg/m   Wt Readings from Last 3 Encounters:  07/01/17 187 lb 12.8 oz (85.2 kg)  06/03/17 189 lb 14.4 oz (86.1 kg)  12/01/16 178 lb (80.7 kg)    Physical Exam  Constitutional: He is oriented to person, place, and time. He appears well-developed and well-nourished. No distress.  HENT:  Head: Normocephalic and atraumatic.  Eyes: Conjunctivae and lids are normal. Right eye exhibits no discharge. Left eye exhibits no discharge. No scleral icterus.  Neck: Normal range of motion. Neck supple. No JVD present. Carotid bruit is not present.  Cardiovascular: Normal rate, regular rhythm and normal heart sounds.  Pulmonary/Chest: Effort normal and breath sounds normal. No respiratory distress.    Abdominal: Normal appearance. There is no splenomegaly or hepatomegaly.  Musculoskeletal: Normal range of motion.  Neurological: He is alert and oriented to person, place, and time.  Skin: Skin is warm, dry and intact. No rash noted. No pallor.  Psychiatric: He has a normal mood and affect. His behavior is normal. Judgment and thought content normal.      Assessment & Plan:   Problem List Items Addressed This Visit      Unprioritized   ED (erectile dysfunction)    On optimum therapy.  Tloerating well.  Continue present medications.        Hypertension    Not to goal.  Will add Losartan to present medications.        Relevant Medications   losartan (COZAAR) 50 MG tablet       Follow up plan: Return in about 4 weeks (around 07/29/2017).

## 2017-08-08 ENCOUNTER — Encounter: Payer: Self-pay | Admitting: Unknown Physician Specialty

## 2017-08-08 ENCOUNTER — Ambulatory Visit: Payer: PRIVATE HEALTH INSURANCE | Admitting: Unknown Physician Specialty

## 2017-08-08 DIAGNOSIS — I10 Essential (primary) hypertension: Secondary | ICD-10-CM | POA: Diagnosis not present

## 2017-08-08 MED ORDER — LOSARTAN POTASSIUM 50 MG PO TABS: 100.0000 mg | ORAL_TABLET | Freq: Every day | ORAL | 1 refills | Status: DC

## 2017-08-08 NOTE — Progress Notes (Signed)
   BP (!) 143/79 (BP Location: Left Arm, Cuff Size: Normal)   Pulse (!) 55   Temp 98 F (36.7 C) (Oral)   Wt 187 lb (84.8 kg)   SpO2 98%   BMI 28.86 kg/m    Subjective:    Patient ID: Michael Zuniga, male    DOB: 11-22-1950, 67 y.o.   MRN: [id number]  HPI: Michael Zuniga is a 67 y.o. male  Chief Complaint  Patient presents with  . Hypertension    4 week f/up    Hypertension Added Losartan last visit.  Pt has not noticed much of a difference. Using medications without difficulty Average home BPs About what it is here  No problems or lightheadedness No chest pain with exertion or shortness of breath No Edema   Relevant past medical, surgical, family and social history reviewed and updated as indicated. Interim medical history since our last visit reviewed. Allergies and medications reviewed and updated.  Review of Systems  Constitutional: Negative.   HENT: Negative.   Eyes: Negative.   Respiratory: Negative.   Cardiovascular: Negative.   Gastrointestinal: Negative.   Endocrine: Negative.   Genitourinary: Negative.   Skin: Negative.   Allergic/Immunologic: Negative.   Neurological: Negative.   Hematological: Negative.   Psychiatric/Behavioral: Negative.     Per HPI unless specifically indicated above     Objective:    BP (!) 143/79 (BP Location: Left Arm, Cuff Size: Normal)   Pulse (!) 55   Temp 98 F (36.7 C) (Oral)   Wt 187 lb (84.8 kg)   SpO2 98%   BMI 28.86 kg/m   Wt Readings from Last 3 Encounters:  08/08/17 187 lb (84.8 kg)  07/01/17 187 lb 12.8 oz (85.2 kg)  06/03/17 189 lb 14.4 oz (86.1 kg)    Physical Exam  Constitutional: He is oriented to person, place, and time. He appears well-developed and well-nourished. No distress.  HENT:  Head: Normocephalic and atraumatic.  Eyes: Conjunctivae and lids are normal. Right eye exhibits no discharge. Left eye exhibits no discharge. No scleral icterus.  Neck: Normal range of motion. Neck supple. No JVD  present. Carotid bruit is not present.  Cardiovascular: Normal rate, regular rhythm and normal heart sounds.  Pulmonary/Chest: Effort normal and breath sounds normal. No respiratory distress.  Abdominal: Normal appearance. There is no splenomegaly or hepatomegaly.  Musculoskeletal: Normal range of motion.  Neurological: He is alert and oriented to person, place, and time.  Skin: Skin is warm, dry and intact. No rash noted. No pallor.  Psychiatric: He has a normal mood and affect. His behavior is normal. Judgment and thought content normal.      Assessment & Plan:   Problem List Items Addressed This Visit      Unprioritized   Hypertension    Increase Losartan from 50 mg to 100mg .  Recheck in 1 month.  He will take 2 pills daily instead of 1      Relevant Medications   losartan (COZAAR) 50 MG tablet       Follow up plan: Return in about 3 weeks (around 08/29/2017).

## 2017-08-08 NOTE — Assessment & Plan Note (Addendum)
Increase Losartan from 50 mg to 100mg .  Recheck in 1 month.  He will take 2 pills daily instead of 1

## 2017-08-16 ENCOUNTER — Other Ambulatory Visit: Payer: Self-pay | Admitting: Unknown Physician Specialty

## 2017-08-30 ENCOUNTER — Ambulatory Visit: Payer: PRIVATE HEALTH INSURANCE | Admitting: Unknown Physician Specialty

## 2017-09-05 ENCOUNTER — Ambulatory Visit: Payer: PRIVATE HEALTH INSURANCE | Admitting: Unknown Physician Specialty

## 2017-09-13 ENCOUNTER — Encounter: Payer: Self-pay | Admitting: Unknown Physician Specialty

## 2017-09-13 ENCOUNTER — Ambulatory Visit: Payer: PRIVATE HEALTH INSURANCE | Admitting: Unknown Physician Specialty

## 2017-09-13 DIAGNOSIS — I10 Essential (primary) hypertension: Secondary | ICD-10-CM

## 2017-09-13 MED ORDER — LOSARTAN POTASSIUM 100 MG PO TABS: 100.0000 mg | ORAL_TABLET | Freq: Every day | ORAL | 3 refills | Status: DC

## 2017-09-13 NOTE — Assessment & Plan Note (Signed)
Improved control on Losartan 100 mg.  New rx written and instructed to take 1 100 mg pill rather than 2 50 mg.  Pt voiced understanding

## 2017-09-13 NOTE — Progress Notes (Signed)
BP 133/82   Pulse (!) 56   Temp 97.8 F (36.6 C) (Oral)   Wt 182 lb 6.4 oz (82.7 kg)   SpO2 97%   BMI 28.15 kg/m    Subjective:    Patient ID: Michael Zuniga, male    DOB: 1951-06-17, 67 y.o.   MRN: [id number]  HPI: Michael Zuniga is a 67 y.o. male  Chief Complaint  Patient presents with  . Hypertension    pt taking 2 tablets of losartan daily   Hypertension Increased Losartan from 50-100 mg last visitUsing medications without difficulty Average home BPs Not checking  No problems or lightheadedness No chest pain with exertion or shortness of breath No Edema   Relevant past medical, surgical, family and social history reviewed and updated as indicated. Interim medical history since our last visit reviewed. Allergies and medications reviewed and updated.  Review of Systems  Per HPI unless specifically indicated above     Objective:    BP 133/82   Pulse (!) 56   Temp 97.8 F (36.6 C) (Oral)   Wt 182 lb 6.4 oz (82.7 kg)   SpO2 97%   BMI 28.15 kg/m   Wt Readings from Last 3 Encounters:  09/13/17 182 lb 6.4 oz (82.7 kg)  08/08/17 187 lb (84.8 kg)  07/01/17 187 lb 12.8 oz (85.2 kg)    Physical Exam  Constitutional: He is oriented to person, place, and time. He appears well-developed and well-nourished. No distress.  HENT:  Head: Normocephalic and atraumatic.  Eyes: Conjunctivae and lids are normal. Right eye exhibits no discharge. Left eye exhibits no discharge. No scleral icterus.  Neck: Normal range of motion. Neck supple. No JVD present. Carotid bruit is not present.  Cardiovascular: Normal rate, regular rhythm and normal heart sounds.  Pulmonary/Chest: Effort normal and breath sounds normal. No respiratory distress.  Abdominal: Normal appearance. There is no splenomegaly or hepatomegaly.  Musculoskeletal: Normal range of motion.  Neurological: He is alert and oriented to person, place, and time.  Skin: Skin is warm, dry and intact. No rash noted. No pallor.    Psychiatric: He has a normal mood and affect. His behavior is normal. Judgment and thought content normal.    Results for orders placed or performed in visit on 10/18/16  Comprehensive metabolic panel  Result Value Ref Range   Glucose 104 (H) 65 - 99 mg/dL   BUN 14 8 - 27 mg/dL   Creatinine, Ser 0.76 0.76 - 1.27 mg/dL   GFR calc non Af Amer 94 >59 mL/min/1.73   GFR calc Af Amer 108 >59 mL/min/1.73   BUN/Creatinine Ratio 18 10 - 24   Sodium 141 134 - 144 mmol/L   Potassium 4.1 3.5 - 5.2 mmol/L   Chloride 100 96 - 106 mmol/L   CO2 24 18 - 29 mmol/L   Calcium 9.8 8.6 - 10.2 mg/dL   Total Protein 7.0 6.0 - 8.5 g/dL   Albumin 4.6 3.6 - 4.8 g/dL   Globulin, Total 2.4 1.5 - 4.5 g/dL   Albumin/Globulin Ratio 1.9 1.2 - 2.2   Bilirubin Total 0.4 0.0 - 1.2 mg/dL   Alkaline Phosphatase 73 39 - 117 IU/L   AST 23 0 - 40 IU/L   ALT 22 0 - 44 IU/L  Lipid Panel w/o Chol/HDL Ratio  Result Value Ref Range   Cholesterol, Total 160 100 - 199 mg/dL   Triglycerides 67 0 - 149 mg/dL   HDL 56 >39 mg/dL   VLDL  Cholesterol Cal 13 5 - 40 mg/dL   LDL Calculated 91 0 - 99 mg/dL  PSA  Result Value Ref Range   Prostate Specific Ag, Serum 0.5 0.0 - 4.0 ng/mL  CBC with Differential/Platelet  Result Value Ref Range   WBC 4.8 3.4 - 10.8 x10E3/uL   RBC 4.47 4.14 - 5.80 x10E6/uL   Hemoglobin 14.2 13.0 - 17.7 g/dL   Hematocrit 41.7 37.5 - 51.0 %   MCV 93 79 - 97 fL   MCH 31.8 26.6 - 33.0 pg   MCHC 34.1 31.5 - 35.7 g/dL   RDW 13.3 12.3 - 15.4 %   Platelets 255 150 - 379 x10E3/uL   Neutrophils 67 Not Estab. %   Lymphs 24 Not Estab. %   Monocytes 8 Not Estab. %   Eos 1 Not Estab. %   Basos 0 Not Estab. %   Neutrophils Absolute 3.2 1.4 - 7.0 x10E3/uL   Lymphocytes Absolute 1.2 0.7 - 3.1 x10E3/uL   Monocytes Absolute 0.4 0.1 - 0.9 x10E3/uL   EOS (ABSOLUTE) 0.1 0.0 - 0.4 x10E3/uL   Basophils Absolute 0.0 0.0 - 0.2 x10E3/uL   Immature Granulocytes 0 Not Estab. %   Immature Grans (Abs) 0.0 0.0 - 0.1  x10E3/uL  TSH  Result Value Ref Range   TSH 1.460 0.450 - 4.500 uIU/mL      Assessment & Plan:   Problem List Items Addressed This Visit      Unprioritized   Hypertension    Improved control on Losartan 100 mg.  New rx written and instructed to take 1 100 mg pill rather than 2 50 mg.  Pt voiced understanding      Relevant Medications   losartan (COZAAR) 100 MG tablet       Follow up plan: Return for Schedule PE.

## 2017-10-17 ENCOUNTER — Other Ambulatory Visit: Payer: Self-pay | Admitting: Unknown Physician Specialty

## 2017-10-21 ENCOUNTER — Encounter: Payer: Self-pay | Admitting: Unknown Physician Specialty

## 2017-10-21 ENCOUNTER — Ambulatory Visit (INDEPENDENT_AMBULATORY_CARE_PROVIDER_SITE_OTHER): Payer: PRIVATE HEALTH INSURANCE | Admitting: Unknown Physician Specialty

## 2017-10-21 VITALS — BP 148/84 | HR 60 | Temp 98.6°F | Ht 67.2 in | Wt 178.5 lb

## 2017-10-21 DIAGNOSIS — N529 Male erectile dysfunction, unspecified: Secondary | ICD-10-CM | POA: Diagnosis not present

## 2017-10-21 DIAGNOSIS — Z0000 Encounter for general adult medical examination without abnormal findings: Secondary | ICD-10-CM | POA: Diagnosis not present

## 2017-10-21 DIAGNOSIS — I10 Essential (primary) hypertension: Secondary | ICD-10-CM | POA: Diagnosis not present

## 2017-10-21 DIAGNOSIS — E7800 Pure hypercholesterolemia, unspecified: Secondary | ICD-10-CM

## 2017-10-21 MED ORDER — SILDENAFIL CITRATE 20 MG PO TABS: ORAL_TABLET | ORAL | 2 refills | Status: DC

## 2017-10-21 MED ORDER — LOSARTAN POTASSIUM 50 MG PO TABS: 50.0000 mg | ORAL_TABLET | Freq: Every day | ORAL | 3 refills | Status: DC

## 2017-10-21 MED ORDER — AMLODIPINE BESYLATE 10 MG PO TABS: 10.0000 mg | ORAL_TABLET | Freq: Every day | ORAL | 3 refills | Status: DC

## 2017-10-21 MED ORDER — ATORVASTATIN CALCIUM 40 MG PO TABS: 40.0000 mg | ORAL_TABLET | Freq: Every day | ORAL | 3 refills | Status: DC

## 2017-10-21 NOTE — Assessment & Plan Note (Signed)
Current medications are effective.  Pt ed on appropriate amount of medication to take

## 2017-10-21 NOTE — Assessment & Plan Note (Addendum)
Stable numbers at home.  Seems to be intolerant to higher doses of Losartan. Will continue what it's taking

## 2017-10-21 NOTE — Assessment & Plan Note (Signed)
Check cholesterol today.  On high dose statin

## 2017-10-21 NOTE — Progress Notes (Signed)
BP (!) 148/84 (BP Location: Left Arm, Cuff Size: Normal)   Pulse 60   Temp 98.6 F (37 C) (Oral)   Ht 5' 7.2" (1.707 m)   Wt 178 lb 8 oz (81 kg)   SpO2 97%   BMI 27.79 kg/m    Subjective:    Patient ID: Michael Zuniga, male    DOB: 1951-04-09, 67 y.o.   MRN: [id number]  HPI: Michael Zuniga is a 67 y.o. male  Chief Complaint  Patient presents with  . Annual Exam  . Hypertension    pt states the losartan 100mg  was making his legs and ankles swell, has been taking 1/2 tablet daily   Hypertension States Losartan caused his legs to swell and made him urinate frequently Average home BPs :  142/182 to 117/70   No problems or lightheadedness No chest pain with exertion or shortness of brea C/o some ankle swelling  Hyperlipidemia Using medications without problems: No Muscle aches  Diet compliance:Exercise:Stays active but no regular exercise  ED Sildenafil working well.  Takes about 3 pills at a time.     Social History   Socioeconomic History  . Marital status: Married    Spouse name: Not on file  . Number of children: 2  . Years of education: Not on file  . Highest education level: Not on file  Occupational History  . Occupation: Glass blower/designer  Social Needs  . Financial resource strain: Not on file  . Food insecurity:    Worry: Not on file    Inability: Not on file  . Transportation needs:    Medical: Not on file    Non-medical: Not on file  Tobacco Use  . Smoking status: Never Smoker  . Smokeless tobacco: Never Used  Substance and Sexual Activity  . Alcohol use: No  . Drug use: No  . Sexual activity: Yes  Lifestyle  . Physical activity:    Days per week: Not on file    Minutes per session: Not on file  . Stress: Not on file  Relationships  . Social connections:    Talks on phone: Not on file    Gets together: Not on file    Attends religious service: Not on file    Active member of club or organization: Not on file    Attends meetings of clubs  or organizations: Not on file    Relationship status: Not on file  . Intimate partner violence:    Fear of current or ex partner: Not on file    Emotionally abused: Not on file    Physically abused: Not on file    Forced sexual activity: Not on file  Other Topics Concern  . Not on file  Social History Narrative  . Not on file   Family History  Problem Relation Age of Onset  . Lung cancer Mother   . Lung cancer Father   . Thyroid disease Daughter   . Arthritis Maternal Grandmother   . Colon cancer Neg Hx    Past Medical History:  Diagnosis Date  . Dysphagia   . High blood pressure    Past Surgical History:  Procedure Laterality Date  . CORONARY ANGIOPLASTY WITH STENT PLACEMENT    . HERNIA REPAIR      Relevant past medical, surgical, family and social history reviewed and updated as indicated. Interim medical history since our last visit reviewed. Allergies and medications reviewed and updated.  Review of Systems  Per HPI unless  specifically indicated above     Objective:    BP (!) 148/84 (BP Location: Left Arm, Cuff Size: Normal)   Pulse 60   Temp 98.6 F (37 C) (Oral)   Ht 5' 7.2" (1.707 m)   Wt 178 lb 8 oz (81 kg)   SpO2 97%   BMI 27.79 kg/m   Wt Readings from Last 3 Encounters:  10/21/17 178 lb 8 oz (81 kg)  09/13/17 182 lb 6.4 oz (82.7 kg)  08/08/17 187 lb (84.8 kg)    Physical Exam  Constitutional: He is oriented to person, place, and time. He appears well-developed and well-nourished. No distress.  HENT:  Head: Normocephalic and atraumatic.  Eyes: Conjunctivae and lids are normal. Right eye exhibits no discharge. Left eye exhibits no discharge. No scleral icterus.  Neck: Normal range of motion. Neck supple. No JVD present. Carotid bruit is not present.  Cardiovascular: Normal rate, regular rhythm and normal heart sounds.  Pulmonary/Chest: Effort normal and breath sounds normal. No respiratory distress.  Abdominal: Normal appearance. There is no  splenomegaly or hepatomegaly.  Musculoskeletal: Normal range of motion.  Neurological: He is alert and oriented to person, place, and time.  Skin: Skin is warm, dry and intact. No rash noted. No pallor.  Psychiatric: He has a normal mood and affect. His behavior is normal. Judgment and thought content normal.    Results for orders placed or performed in visit on 10/18/16  Comprehensive metabolic panel  Result Value Ref Range   Glucose 104 (H) 65 - 99 mg/dL   BUN 14 8 - 27 mg/dL   Creatinine, Ser 0.76 0.76 - 1.27 mg/dL   GFR calc non Af Amer 94 >59 mL/min/1.73   GFR calc Af Amer 108 >59 mL/min/1.73   BUN/Creatinine Ratio 18 10 - 24   Sodium 141 134 - 144 mmol/L   Potassium 4.1 3.5 - 5.2 mmol/L   Chloride 100 96 - 106 mmol/L   CO2 24 18 - 29 mmol/L   Calcium 9.8 8.6 - 10.2 mg/dL   Total Protein 7.0 6.0 - 8.5 g/dL   Albumin 4.6 3.6 - 4.8 g/dL   Globulin, Total 2.4 1.5 - 4.5 g/dL   Albumin/Globulin Ratio 1.9 1.2 - 2.2   Bilirubin Total 0.4 0.0 - 1.2 mg/dL   Alkaline Phosphatase 73 39 - 117 IU/L   AST 23 0 - 40 IU/L   ALT 22 0 - 44 IU/L  Lipid Panel w/o Chol/HDL Ratio  Result Value Ref Range   Cholesterol, Total 160 100 - 199 mg/dL   Triglycerides 67 0 - 149 mg/dL   HDL 56 >39 mg/dL   VLDL Cholesterol Cal 13 5 - 40 mg/dL   LDL Calculated 91 0 - 99 mg/dL  PSA  Result Value Ref Range   Prostate Specific Ag, Serum 0.5 0.0 - 4.0 ng/mL  CBC with Differential/Platelet  Result Value Ref Range   WBC 4.8 3.4 - 10.8 x10E3/uL   RBC 4.47 4.14 - 5.80 x10E6/uL   Hemoglobin 14.2 13.0 - 17.7 g/dL   Hematocrit 41.7 37.5 - 51.0 %   MCV 93 79 - 97 fL   MCH 31.8 26.6 - 33.0 pg   MCHC 34.1 31.5 - 35.7 g/dL   RDW 13.3 12.3 - 15.4 %   Platelets 255 150 - 379 x10E3/uL   Neutrophils 67 Not Estab. %   Lymphs 24 Not Estab. %   Monocytes 8 Not Estab. %   Eos 1 Not Estab. %   Basos  0 Not Estab. %   Neutrophils Absolute 3.2 1.4 - 7.0 x10E3/uL   Lymphocytes Absolute 1.2 0.7 - 3.1 x10E3/uL    Monocytes Absolute 0.4 0.1 - 0.9 x10E3/uL   EOS (ABSOLUTE) 0.1 0.0 - 0.4 x10E3/uL   Basophils Absolute 0.0 0.0 - 0.2 x10E3/uL   Immature Granulocytes 0 Not Estab. %   Immature Grans (Abs) 0.0 0.0 - 0.1 x10E3/uL  TSH  Result Value Ref Range   TSH 1.460 0.450 - 4.500 uIU/mL      Assessment & Plan:   Problem List Items Addressed This Visit      Unprioritized   ED (erectile dysfunction)    Current medications are effective.  Pt ed on appropriate amount of medication to take      Hyperlipidemia    Check cholesterol today.  On high dose statin      Relevant Medications   sildenafil (REVATIO) 20 MG tablet   amLODipine (NORVASC) 10 MG tablet   atorvastatin (LIPITOR) 40 MG tablet   losartan (COZAAR) 50 MG tablet   Other Relevant Orders   Lipid Panel w/o Chol/HDL Ratio   Hypertension    Stable numbers at home.  Seems to be intolerant to higher doses of Losartan. Will continue what it's taking      Relevant Medications   sildenafil (REVATIO) 20 MG tablet   amLODipine (NORVASC) 10 MG tablet   atorvastatin (LIPITOR) 40 MG tablet   losartan (COZAAR) 50 MG tablet   Other Relevant Orders   Comprehensive metabolic panel    Other Visit Diagnoses    Annual physical exam    -  Primary   Relevant Orders   CBC with Differential/Platelet   PSA   TSH      HM: Refusing tetnus Colonoscopy due 2020  Follow up plan: Return in about 6 months (around 04/22/2018).

## 2017-10-22 LAB — CBC WITH DIFFERENTIAL/PLATELET
Basophils Absolute: 0 10*3/uL (ref 0.0–0.2)
Basos: 1 %
EOS (ABSOLUTE): 0 10*3/uL (ref 0.0–0.4)
Eos: 1 %
Hematocrit: 38.8 % (ref 37.5–51.0)
Hemoglobin: 13.3 g/dL (ref 13.0–17.7)
Immature Grans (Abs): 0 10*3/uL (ref 0.0–0.1)
Immature Granulocytes: 0 %
Lymphocytes Absolute: 1 10*3/uL (ref 0.7–3.1)
Lymphs: 24 %
MCH: 32.4 pg (ref 26.6–33.0)
MCHC: 34.3 g/dL (ref 31.5–35.7)
MCV: 95 fL (ref 79–97)
Monocytes Absolute: 0.5 10*3/uL (ref 0.1–0.9)
Monocytes: 11 %
Neutrophils Absolute: 2.7 10*3/uL (ref 1.4–7.0)
Neutrophils: 63 %
Platelets: 284 10*3/uL (ref 150–379)
RBC: 4.1 x10E6/uL — ABNORMAL LOW (ref 4.14–5.80)
RDW: 14.1 % (ref 12.3–15.4)
WBC: 4.2 10*3/uL (ref 3.4–10.8)

## 2017-10-22 LAB — COMPREHENSIVE METABOLIC PANEL
ALT: 25 IU/L (ref 0–44)
AST: 24 IU/L (ref 0–40)
Albumin/Globulin Ratio: 2.2 (ref 1.2–2.2)
Albumin: 4.8 g/dL (ref 3.6–4.8)
Alkaline Phosphatase: 73 IU/L (ref 39–117)
BUN/Creatinine Ratio: 18 (ref 10–24)
BUN: 16 mg/dL (ref 8–27)
Bilirubin Total: 0.6 mg/dL (ref 0.0–1.2)
CO2: 24 mmol/L (ref 20–29)
Calcium: 9.8 mg/dL (ref 8.6–10.2)
Chloride: 103 mmol/L (ref 96–106)
Creatinine, Ser: 0.9 mg/dL (ref 0.76–1.27)
GFR calc Af Amer: 100 mL/min/{1.73_m2} (ref >59)
GFR calc non Af Amer: 87 mL/min/{1.73_m2} (ref >59)
Globulin, Total: 2.2 g/dL (ref 1.5–4.5)
Glucose: 108 mg/dL — ABNORMAL HIGH (ref 65–99)
Potassium: 4.1 mmol/L (ref 3.5–5.2)
Sodium: 140 mmol/L (ref 134–144)
Total Protein: 7 g/dL (ref 6.0–8.5)

## 2017-10-22 LAB — TSH: TSH: 0.885 u[IU]/mL (ref 0.450–4.500)

## 2017-10-22 LAB — LIPID PANEL W/O CHOL/HDL RATIO
Cholesterol, Total: 136 mg/dL (ref 100–199)
HDL: 63 mg/dL (ref >39)
LDL Calculated: 61 mg/dL (ref 0–99)
Triglycerides: 58 mg/dL (ref 0–149)
VLDL Cholesterol Cal: 12 mg/dL (ref 5–40)

## 2017-10-22 LAB — PSA: Prostate Specific Ag, Serum: 0.9 ng/mL (ref 0.0–4.0)

## 2017-10-24 ENCOUNTER — Encounter: Payer: Self-pay | Admitting: Unknown Physician Specialty

## 2018-04-21 ENCOUNTER — Encounter: Payer: Self-pay | Admitting: Unknown Physician Specialty

## 2018-04-21 ENCOUNTER — Ambulatory Visit: Payer: PRIVATE HEALTH INSURANCE | Admitting: Unknown Physician Specialty

## 2018-04-21 DIAGNOSIS — I7300 Raynaud's syndrome without gangrene: Secondary | ICD-10-CM

## 2018-04-21 DIAGNOSIS — G5602 Carpal tunnel syndrome, left upper limb: Secondary | ICD-10-CM | POA: Diagnosis not present

## 2018-04-21 DIAGNOSIS — Z23 Encounter for immunization: Secondary | ICD-10-CM

## 2018-04-21 DIAGNOSIS — E7800 Pure hypercholesterolemia, unspecified: Secondary | ICD-10-CM | POA: Diagnosis not present

## 2018-04-21 DIAGNOSIS — I10 Essential (primary) hypertension: Secondary | ICD-10-CM | POA: Diagnosis not present

## 2018-04-21 DIAGNOSIS — G5600 Carpal tunnel syndrome, unspecified upper limb: Secondary | ICD-10-CM | POA: Insufficient documentation

## 2018-04-21 NOTE — Assessment & Plan Note (Signed)
A little high here but good numbers at home

## 2018-04-21 NOTE — Assessment & Plan Note (Signed)
No current problems.  Pt states he was diagnosed years ago

## 2018-04-21 NOTE — Progress Notes (Addendum)
BP (!) 146/82 (BP Location: Left Arm, Patient Position: Sitting, Cuff Size: Normal)   Pulse 65   Wt 181 lb (82.1 kg)   SpO2 98%   BMI 28.18 kg/m    Subjective:    Patient ID: Michael Zuniga, male    DOB: 1951/03/18, 67 y.o.   MRN: [id number]  HPI: Michael Zuniga is a 67 y.o. male  Chief Complaint  Patient presents with  . Hypertension  . Hyperlipidemia   Hypertension Using medications without difficulty Average home BPs 120s/70s.  Checks it once or twice a day.     No problems or lightheadedness No chest pain with exertion or shortness of breath No Edema   Hyperlipidemia Using medications without problems: No Muscle aches  Diet compliance: Exercise:  Hand tendonitis Left hand numbness in multiple fingers.  Had surgery before and doesn't want it again as he needs to work.   States he does not use a splint as it is "painful." States the smallest amount of pressure bothers him.  History also includes Raynaud's.  States it is improving  Relevant past medical, surgical, family and social history reviewed and updated as indicated. Interim medical history since our last visit reviewed. Allergies and medications reviewed and updated.  Review of Systems  Constitutional: Negative.   HENT: Negative.   Eyes: Negative.   Respiratory: Negative.   Cardiovascular: Negative.   Gastrointestinal: Negative.   Endocrine: Negative.   Genitourinary: Negative.   Skin: Negative.   Allergic/Immunologic: Negative.   Neurological: Negative.   Hematological: Negative.   Psychiatric/Behavioral: Negative.     Per HPI unless specifically indicated above     Objective:    BP (!) 146/82 (BP Location: Left Arm, Patient Position: Sitting, Cuff Size: Normal)   Pulse 65   Wt 181 lb (82.1 kg)   SpO2 98%   BMI 28.18 kg/m   Wt Readings from Last 3 Encounters:  04/21/18 181 lb (82.1 kg)  10/21/17 178 lb 8 oz (81 kg)  09/13/17 182 lb 6.4 oz (82.7 kg)    Physical Exam  Constitutional: He  is oriented to person, place, and time. He appears well-developed and well-nourished. No distress.  HENT:  Head: Normocephalic and atraumatic.  Eyes: Conjunctivae and lids are normal. Right eye exhibits no discharge. Left eye exhibits no discharge. No scleral icterus.  Neck: Normal range of motion. Neck supple. No JVD present. Carotid bruit is not present.  Cardiovascular: Normal rate, regular rhythm and normal heart sounds.  Pulmonary/Chest: Effort normal and breath sounds normal. No respiratory distress.  Abdominal: Normal appearance. There is no splenomegaly or hepatomegaly.  Musculoskeletal: Normal range of motion.  Neurological: He is alert and oriented to person, place, and time.  Skin: Skin is warm, dry and intact. No rash noted. No pallor.  Psychiatric: He has a normal mood and affect. His behavior is normal. Judgment and thought content normal.    Results for orders placed or performed in visit on 10/21/17  CBC with Differential/Platelet  Result Value Ref Range   WBC 4.2 3.4 - 10.8 x10E3/uL   RBC 4.10 (L) 4.14 - 5.80 x10E6/uL   Hemoglobin 13.3 13.0 - 17.7 g/dL   Hematocrit 38.8 37.5 - 51.0 %   MCV 95 79 - 97 fL   MCH 32.4 26.6 - 33.0 pg   MCHC 34.3 31.5 - 35.7 g/dL   RDW 14.1 12.3 - 15.4 %   Platelets 284 150 - 379 x10E3/uL   Neutrophils 63 Not Estab. %  Lymphs 24 Not Estab. %   Monocytes 11 Not Estab. %   Eos 1 Not Estab. %   Basos 1 Not Estab. %   Neutrophils Absolute 2.7 1.4 - 7.0 x10E3/uL   Lymphocytes Absolute 1.0 0.7 - 3.1 x10E3/uL   Monocytes Absolute 0.5 0.1 - 0.9 x10E3/uL   EOS (ABSOLUTE) 0.0 0.0 - 0.4 x10E3/uL   Basophils Absolute 0.0 0.0 - 0.2 x10E3/uL   Immature Granulocytes 0 Not Estab. %   Immature Grans (Abs) 0.0 0.0 - 0.1 x10E3/uL  Comprehensive metabolic panel  Result Value Ref Range   Glucose 108 (H) 65 - 99 mg/dL   BUN 16 8 - 27 mg/dL   Creatinine, Ser 0.90 0.76 - 1.27 mg/dL   GFR calc non Af Amer 87 >59 mL/min/1.73   GFR calc Af Amer 100 >59  mL/min/1.73   BUN/Creatinine Ratio 18 10 - 24   Sodium 140 134 - 144 mmol/L   Potassium 4.1 3.5 - 5.2 mmol/L   Chloride 103 96 - 106 mmol/L   CO2 24 20 - 29 mmol/L   Calcium 9.8 8.6 - 10.2 mg/dL   Total Protein 7.0 6.0 - 8.5 g/dL   Albumin 4.8 3.6 - 4.8 g/dL   Globulin, Total 2.2 1.5 - 4.5 g/dL   Albumin/Globulin Ratio 2.2 1.2 - 2.2   Bilirubin Total 0.6 0.0 - 1.2 mg/dL   Alkaline Phosphatase 73 39 - 117 IU/L   AST 24 0 - 40 IU/L   ALT 25 0 - 44 IU/L  Lipid Panel w/o Chol/HDL Ratio  Result Value Ref Range   Cholesterol, Total 136 100 - 199 mg/dL   Triglycerides 58 0 - 149 mg/dL   HDL 63 >39 mg/dL   VLDL Cholesterol Cal 12 5 - 40 mg/dL   LDL Calculated 61 0 - 99 mg/dL  PSA  Result Value Ref Range   Prostate Specific Ag, Serum 0.9 0.0 - 4.0 ng/mL  TSH  Result Value Ref Range   TSH 0.885 0.450 - 4.500 uIU/mL      Assessment & Plan:   Problem List Items Addressed This Visit      Unprioritized   Carpal tunnel syndrome    Pt will try to wear a splint.  Has been to Orthopedics in the past      Hyperlipidemia    Stable, continue present medications.        Hypertension    A little high here but good numbers at home      Raynaud phenomenon    No current problems.  Pt states he was diagnosed years ago          Follow up plan: Return in about 6 months (around 10/21/2018) for physical.

## 2018-04-21 NOTE — Assessment & Plan Note (Signed)
Pt will try to wear a splint.  Has been to Orthopedics in the past

## 2018-04-21 NOTE — Assessment & Plan Note (Signed)
Stable, continue present medications.   

## 2018-08-05 ENCOUNTER — Other Ambulatory Visit: Payer: Self-pay | Admitting: Unknown Physician Specialty

## 2018-08-07 NOTE — Telephone Encounter (Signed)
Requested medication (s) are due for refill today: yes  Requested medication (s) are on the active medication list: yes  Last refill:  10/21/17 for 30 tabs and 2 refills  Future visit scheduled: yes  Notes to clinic:  Erectile dysfunction agents failed.  Requested Prescriptions  Pending Prescriptions Disp Refills   sildenafil (REVATIO) 20 MG tablet [Pharmacy Med Name: SILDENAFIL 20 MG TABLET] 30 tablet 2    Sig: TAKE 1-5 TABLETS AS NEEDED     Urology: Erectile Dysfunction Agents Failed - 08/05/2018  9:35 AM      Failed - Last BP in normal range    BP Readings from Last 1 Encounters:  04/21/18 (!) 146/82         Passed - Valid encounter within last 12 months    Recent Outpatient Visits          3 months ago Essential hypertension   [facility] Kathrine Haddock, NP   9 months ago Annual physical exam   [facility] Kathrine Haddock, NP   10 months ago Essential hypertension   [facility] Kathrine Haddock, NP   12 months ago Essential hypertension   [place] Kathrine Haddock, NP   1 year ago Essential hypertension   [place] Kathrine Haddock, NP      Future Appointments            In 2 months Cannady, Barbaraann Faster, NP [facility]

## 2018-10-13 ENCOUNTER — Other Ambulatory Visit: Payer: Self-pay | Admitting: Nurse Practitioner

## 2018-10-13 DIAGNOSIS — E7800 Pure hypercholesterolemia, unspecified: Secondary | ICD-10-CM

## 2018-10-13 DIAGNOSIS — I10 Essential (primary) hypertension: Secondary | ICD-10-CM

## 2018-10-13 DIAGNOSIS — Z1329 Encounter for screening for other suspected endocrine disorder: Secondary | ICD-10-CM

## 2018-10-13 DIAGNOSIS — Z125 Encounter for screening for malignant neoplasm of prostate: Secondary | ICD-10-CM

## 2018-10-13 DIAGNOSIS — Z0000 Encounter for general adult medical examination without abnormal findings: Secondary | ICD-10-CM

## 2018-10-19 ENCOUNTER — Other Ambulatory Visit: Payer: PRIVATE HEALTH INSURANCE

## 2018-10-20 ENCOUNTER — Encounter: Payer: PRIVATE HEALTH INSURANCE | Admitting: Nurse Practitioner

## 2018-11-02 ENCOUNTER — Other Ambulatory Visit: Payer: Self-pay | Admitting: Unknown Physician Specialty

## 2018-11-03 ENCOUNTER — Other Ambulatory Visit: Payer: Self-pay | Admitting: Unknown Physician Specialty

## 2018-11-03 NOTE — Telephone Encounter (Signed)
Requested Prescriptions  Pending Prescriptions Disp Refills  . atorvastatin (LIPITOR) 40 MG tablet [Pharmacy Med Name: ATORVASTATIN 40 MG TABLET] 90 tablet 0    Sig: TAKE 1 TABLET BY MOUTH EVERY DAY     Cardiovascular:  Antilipid - Statins Failed - 11/03/2018  5:17 PM      Failed - Total Cholesterol in normal range and within 360 days    Cholesterol, Total  Date Value Ref Range Status  10/21/2017 136 100 - 199 mg/dL Final   Cholesterol Piccolo, Waived  Date Value Ref Range Status  04/19/2016 128 <200 mg/dL Final    Comment:                            Desirable                <200                         Borderline High      200- 239                         High                     >239          Failed - LDL in normal range and within 360 days    LDL Calculated  Date Value Ref Range Status  10/21/2017 61 0 - 99 mg/dL Final         Failed - HDL in normal range and within 360 days    HDL  Date Value Ref Range Status  10/21/2017 63 >39 mg/dL Final         Failed - Triglycerides in normal range and within 360 days    Triglycerides  Date Value Ref Range Status  10/21/2017 58 0 - 149 mg/dL Final   Triglycerides Piccolo,Waived  Date Value Ref Range Status  04/19/2016 114 <150 mg/dL Final    Comment:                            Normal                   <150                         Borderline High     150 - 199                         High                200 - 499                         Very High                >499          Passed - Patient is not pregnant      Passed - Valid encounter within last 12 months    Recent Outpatient Visits          6 months ago Essential hypertension   [facility] Kathrine Haddock, NP   1 year ago Annual physical exam   [facility] Kathrine Haddock, NP   1 year ago Essential  hypertension   [facility] Kathrine Haddock, NP   1 year ago Essential hypertension   [facility] Kathrine Haddock, NP   1 year ago Essential hypertension   [facility] Kathrine Haddock, NP      Future Appointments            In 2 months Cannady, Barbaraann Faster, NP [facility]

## 2018-11-04 ENCOUNTER — Other Ambulatory Visit: Payer: Self-pay | Admitting: Unknown Physician Specialty

## 2018-11-04 NOTE — Telephone Encounter (Signed)
Please advise if ok to refill. 

## 2018-11-06 NOTE — Telephone Encounter (Signed)
Will refill until next appointment in July.  For further refills will need to come to upcoming July appointment for assessment and labs.

## 2018-11-10 ENCOUNTER — Telehealth: Payer: Self-pay | Admitting: Nurse Practitioner

## 2018-11-10 DIAGNOSIS — K222 Esophageal obstruction: Secondary | ICD-10-CM

## 2018-11-10 NOTE — Telephone Encounter (Signed)
Copied from [place] [id number]. Topic: General - Inquiry >> Nov 09, 2018  4:21 PM Richardo Priest, [place] wrote: Reason for CRM: Patient's wife is calling in stating patient is in need of having his esophagus stretched again and is in need of a referral for a place local. Call back is [phone].

## 2018-11-13 ENCOUNTER — Other Ambulatory Visit: Payer: Self-pay | Admitting: Unknown Physician Specialty

## 2018-11-23 ENCOUNTER — Encounter: Payer: Self-pay | Admitting: *Deleted

## 2019-01-18 ENCOUNTER — Telehealth: Payer: Self-pay | Admitting: Nurse Practitioner

## 2019-01-18 NOTE — Telephone Encounter (Signed)
Called pt to go reschedule physical no answer left vm

## 2019-01-19 ENCOUNTER — Encounter: Payer: PRIVATE HEALTH INSURANCE | Admitting: Nurse Practitioner

## 2019-01-25 ENCOUNTER — Other Ambulatory Visit: Payer: Self-pay | Admitting: Unknown Physician Specialty

## 2019-01-25 NOTE — Telephone Encounter (Signed)
Will provide enough until September appointment, but he needs to make this appointment for further refills. Not seen in office since October 2019.  Thank you.

## 2019-01-25 NOTE — Telephone Encounter (Signed)
Pt has a pending ov on 03/06/19 would you like to send enough to get him to his appointment?

## 2019-01-25 NOTE — Telephone Encounter (Signed)
For further refills he will need to see provider.

## 2019-01-25 NOTE — Telephone Encounter (Signed)
Routing to provider  

## 2019-01-29 ENCOUNTER — Other Ambulatory Visit: Payer: Self-pay | Admitting: Nurse Practitioner

## 2019-01-29 NOTE — Telephone Encounter (Signed)
Requested Prescriptions  Pending Prescriptions Disp Refills  . amLODipine (NORVASC) 10 MG tablet [Pharmacy Med Name: AMLODIPINE BESYLATE 10 MG TAB] 90 tablet 0    Sig: TAKE 1 TABLET BY MOUTH EVERY DAY     Cardiovascular:  Calcium Channel Blockers Failed - 01/29/2019  1:33 AM      Failed - Last BP in normal range    BP Readings from Last 1 Encounters:  04/21/18 (!) 146/82         Failed - Valid encounter within last 6 months    Recent Outpatient Visits          9 months ago Essential hypertension   [place] Kathrine Haddock, NP   1 year ago Annual physical exam   [facility] Kathrine Haddock, NP   1 year ago Essential hypertension   [place] Kathrine Haddock, NP   1 year ago Essential hypertension   [place] Kathrine Haddock, NP   1 year ago Essential hypertension   [place], [place], NP      Future Appointments            In 1 month Cannady, Barbaraann Faster, NP [facility]

## 2019-02-20 ENCOUNTER — Other Ambulatory Visit: Payer: Self-pay | Admitting: Nurse Practitioner

## 2019-02-20 NOTE — Telephone Encounter (Signed)
30 day courtesy refill given. 

## 2019-03-06 ENCOUNTER — Encounter: Payer: Self-pay | Admitting: Nurse Practitioner

## 2019-03-06 ENCOUNTER — Other Ambulatory Visit: Payer: Self-pay

## 2019-03-06 ENCOUNTER — Ambulatory Visit (INDEPENDENT_AMBULATORY_CARE_PROVIDER_SITE_OTHER): Payer: PRIVATE HEALTH INSURANCE | Admitting: Nurse Practitioner

## 2019-03-06 VITALS — BP 118/68 | HR 67 | Temp 98.2°F | Ht 67.2 in | Wt 178.0 lb

## 2019-03-06 DIAGNOSIS — Z23 Encounter for immunization: Secondary | ICD-10-CM

## 2019-03-06 DIAGNOSIS — E7800 Pure hypercholesterolemia, unspecified: Secondary | ICD-10-CM | POA: Diagnosis not present

## 2019-03-06 DIAGNOSIS — Z0000 Encounter for general adult medical examination without abnormal findings: Secondary | ICD-10-CM

## 2019-03-06 DIAGNOSIS — I2510 Atherosclerotic heart disease of native coronary artery without angina pectoris: Secondary | ICD-10-CM | POA: Diagnosis not present

## 2019-03-06 DIAGNOSIS — I10 Essential (primary) hypertension: Secondary | ICD-10-CM | POA: Diagnosis not present

## 2019-03-06 DIAGNOSIS — I7300 Raynaud's syndrome without gangrene: Secondary | ICD-10-CM

## 2019-03-06 DIAGNOSIS — N529 Male erectile dysfunction, unspecified: Secondary | ICD-10-CM

## 2019-03-06 MED ORDER — LOSARTAN POTASSIUM 50 MG PO TABS: 50.0000 mg | ORAL_TABLET | Freq: Every day | ORAL | 3 refills | Status: DC

## 2019-03-06 MED ORDER — AMLODIPINE BESYLATE 10 MG PO TABS: 10.0000 mg | ORAL_TABLET | Freq: Every day | ORAL | 3 refills | Status: DC

## 2019-03-06 MED ORDER — ATORVASTATIN CALCIUM 40 MG PO TABS: 40.0000 mg | ORAL_TABLET | Freq: Every day | ORAL | 3 refills | Status: DC

## 2019-03-06 NOTE — Assessment & Plan Note (Signed)
Chronic, ongoing. Initial BP elevated, but repeat below goal and home BP at goal.  Continue to monitor at home regularly and continue current medication regimen.  Labs today and refills sent.  Return in 6 months.

## 2019-03-06 NOTE — Assessment & Plan Note (Signed)
Current medication regimen effective, refills sent.  Patient educated on appropriate amount of medication to take.

## 2019-03-06 NOTE — Patient Instructions (Signed)

## 2019-03-06 NOTE — Assessment & Plan Note (Signed)
Chronic, ongoing.  Continue current medication regimen and adjust as needed.  Labs today.  Return in 6 months. 

## 2019-03-06 NOTE — Assessment & Plan Note (Addendum)
No current issues, has given up diving which helps.  Continue to monitor and return for worsening symptoms.  Continue Amlodipine. 

## 2019-03-06 NOTE — Assessment & Plan Note (Signed)
Continue daily ASA and Lipitor, adjust Lipitor dose as needed to meet goals and prevent stroke. °

## 2019-03-06 NOTE — Progress Notes (Signed)
BP 118/68 (BP Location: Left Arm, Patient Position: Sitting, Cuff Size: Normal)   Pulse 67   Temp 98.2 F (36.8 C) (Oral)   Ht 5' 7.2" (1.707 m)   Wt 178 lb (80.7 kg)   SpO2 96%   BMI 27.71 kg/m    Subjective:    Patient ID: Michael Zuniga, male    DOB: September 09, 1950, 68 y.o.   MRN: [id number]  HPI: Michael Zuniga is a 68 y.o. male presenting on 03/06/2019 for comprehensive medical examination. Current medical complaints include:none  He currently lives with: self Interim Problems from his last visit: no   HYPERTENSION / HYPERLIPIDEMIA Continues on Amlodipine and Losartan + Lipitor for HLD. Satisfied with current treatment? yes Duration of hypertension: chronic BP monitoring frequency: daily BP range: 12-130/70-80 at home when checks BP medication side effects: no Duration of hyperlipidemia: chronic Cholesterol medication side effects: no Cholesterol supplements: none Medication compliance: good compliance Aspirin: yes Recent stressors: no Recurrent headaches: no Visual changes: no Palpitations: no Dyspnea: no Chest pain: no Lower extremity edema: no Dizzy/lightheaded: no  Functional Status Survey: Is the patient deaf or have difficulty hearing?: No Does the patient have difficulty seeing, even when wearing glasses/contacts?: No Does the patient have difficulty concentrating, remembering, or making decisions?: No Does the patient have difficulty walking or climbing stairs?: No Does the patient have difficulty dressing or bathing?: No Does the patient have difficulty doing errands alone such as visiting a doctor's office or shopping?: No  FALL RISK: Fall Risk  03/06/2019 10/21/2017 10/18/2016 02/16/2016  Falls in the past year? 0 No No No  Number falls in past yr: 0 - - -  Injury with Fall? 0 - - -    Depression Screen Depression screen [facility] 2/9 03/06/2019 10/21/2017 10/18/2016 02/16/2016  Decreased Interest 0 0 0 0  Down, Depressed, Hopeless 0 0 0 0  PHQ - 2 Score 0 0 0 0   Altered sleeping 1 1 - -  Tired, decreased energy 0 0 - -  Change in appetite 0 0 - -  Feeling bad or failure about yourself  0 0 - -  Trouble concentrating 0 0 - -  Moving slowly or fidgety/restless 0 0 - -  Suicidal thoughts 0 0 - -  PHQ-9 Score 1 1 - -  Difficult doing work/chores Not difficult at all - - -    Advanced Directives <no information>  Past Medical History:  Past Medical History:  Diagnosis Date  . Dysphagia   . High blood pressure     Surgical History:  Past Surgical History:  Procedure Laterality Date  . CORONARY ANGIOPLASTY WITH STENT PLACEMENT    . HERNIA REPAIR      Medications:  Current Outpatient Medications on File Prior to Visit  Medication Sig  . aspirin EC 81 MG tablet Take 81 mg by mouth daily.  . sildenafil (REVATIO) 20 MG tablet TAKE 1-5 TABLETS AS NEEDED   Current Facility-Administered Medications on File Prior to Visit  Medication  . 0.9 %  sodium chloride infusion    Allergies:  No Known Allergies  Social History:  Social History   Socioeconomic History  . Marital status: Married    Spouse name: Not on file  . Number of children: 2  . Years of education: Not on file  . Highest education level: Not on file  Occupational History  . Occupation: Glass blower/designer  Social Needs  . Financial resource strain: Not on file  . Food  insecurity    Worry: Not on file    Inability: Not on file  . Transportation needs    Medical: Not on file    Non-medical: Not on file  Tobacco Use  . Smoking status: Never Smoker  . Smokeless tobacco: Never Used  Substance and Sexual Activity  . Alcohol use: No  . Drug use: No  . Sexual activity: Yes  Lifestyle  . Physical activity    Days per week: Not on file    Minutes per session: Not on file  . Stress: Not on file  Relationships  . Social Herbalist on phone: Not on file    Gets together: Not on file    Attends religious service: Not on file    Active member of club or  organization: Not on file    Attends meetings of clubs or organizations: Not on file    Relationship status: Not on file  . Intimate partner violence    Fear of current or ex partner: Not on file    Emotionally abused: Not on file    Physically abused: Not on file    Forced sexual activity: Not on file  Other Topics Concern  . Not on file  Social History Narrative  . Not on file   Social History   Tobacco Use  Smoking Status Never Smoker  Smokeless Tobacco Never Used   Social History   Substance and Sexual Activity  Alcohol Use No    Family History:  Family History  Problem Relation Age of Onset  . Lung cancer Mother   . Lung cancer Father   . Thyroid disease Daughter   . Arthritis Maternal Grandmother   . Colon cancer Neg Hx     Past medical history, surgical history, medications, allergies, family history and social history reviewed with patient today and changes made to appropriate areas of the chart.   Review of Systems - negative All other ROS negative except what is listed above and in the HPI.      Objective:    BP 118/68 (BP Location: Left Arm, Patient Position: Sitting, Cuff Size: Normal)   Pulse 67   Temp 98.2 F (36.8 C) (Oral)   Ht 5' 7.2" (1.707 m)   Wt 178 lb (80.7 kg)   SpO2 96%   BMI 27.71 kg/m   Wt Readings from Last 3 Encounters:  03/06/19 178 lb (80.7 kg)  04/21/18 181 lb (82.1 kg)  10/21/17 178 lb 8 oz (81 kg)    Physical Exam Vitals signs and nursing note reviewed.  Constitutional:      General: He is awake. He is not in acute distress.    Appearance: He is well-developed. He is not ill-appearing.  HENT:     Head: Normocephalic and atraumatic.     Right Ear: Hearing, tympanic membrane, ear canal and external ear normal. No drainage.     Left Ear: Hearing, tympanic membrane, ear canal and external ear normal. No drainage.     Nose: Nose normal.     Mouth/Throat:     Mouth: Mucous membranes are moist.     Pharynx: Uvula midline.   Eyes:     General: Lids are normal.        Right eye: No discharge.        Left eye: No discharge.     Extraocular Movements: Extraocular movements intact.     Conjunctiva/sclera: Conjunctivae normal.     Pupils: Pupils are equal,  round, and reactive to light.     Visual Fields: Right eye visual fields normal and left eye visual fields normal.  Neck:     Musculoskeletal: Normal range of motion and neck supple.     Thyroid: No thyromegaly.     Vascular: No carotid bruit.  Cardiovascular:     Rate and Rhythm: Normal rate and regular rhythm.     Heart sounds: Normal heart sounds, S1 normal and S2 normal. No murmur. No gallop.   Pulmonary:     Effort: Pulmonary effort is normal. No accessory muscle usage or respiratory distress.     Breath sounds: Normal breath sounds.  Abdominal:     General: Bowel sounds are normal.     Palpations: Abdomen is soft. There is no hepatomegaly or splenomegaly.     Tenderness: There is no abdominal tenderness.     Hernia: There is no hernia in the left inguinal area or right inguinal area.  Genitourinary:    Penis: Normal.      Scrotum/Testes: Normal.     Comments: Deferred prostate exam per patient request. Musculoskeletal: Normal range of motion.     Right lower leg: No edema.     Left lower leg: No edema.  Skin:    General: Skin is warm and dry.     Capillary Refill: Capillary refill takes less than 2 seconds.     Findings: No rash.  Neurological:     Mental Status: He is alert and oriented to person, place, and time.     Cranial Nerves: Cranial nerves are intact.     Gait: Gait is intact.     Deep Tendon Reflexes: Reflexes are normal and symmetric.     Reflex Scores:      Brachioradialis reflexes are 2+ on the right side and 2+ on the left side.      Patellar reflexes are 2+ on the right side and 2+ on the left side. Psychiatric:        Attention and Perception: Attention normal.        Mood and Affect: Mood normal.        Speech: Speech  normal.        Behavior: Behavior normal. Behavior is cooperative.        Thought Content: Thought content normal.        Judgment: Judgment normal.    6CIT Screen 03/06/2019  What Year? 0 points  What month? 0 points  What time? 0 points  Count back from 20 0 points  Months in reverse 0 points  Repeat phrase 0 points  Total Score 0    Results for orders placed or performed in visit on 10/21/17  CBC with Differential/Platelet  Result Value Ref Range   WBC 4.2 3.4 - 10.8 x10E3/uL   RBC 4.10 (L) 4.14 - 5.80 x10E6/uL   Hemoglobin 13.3 13.0 - 17.7 g/dL   Hematocrit 38.8 37.5 - 51.0 %   MCV 95 79 - 97 fL   MCH 32.4 26.6 - 33.0 pg   MCHC 34.3 31.5 - 35.7 g/dL   RDW 14.1 12.3 - 15.4 %   Platelets 284 150 - 379 x10E3/uL   Neutrophils 63 Not Estab. %   Lymphs 24 Not Estab. %   Monocytes 11 Not Estab. %   Eos 1 Not Estab. %   Basos 1 Not Estab. %   Neutrophils Absolute 2.7 1.4 - 7.0 x10E3/uL   Lymphocytes Absolute 1.0 0.7 - 3.1 x10E3/uL   Monocytes  Absolute 0.5 0.1 - 0.9 x10E3/uL   EOS (ABSOLUTE) 0.0 0.0 - 0.4 x10E3/uL   Basophils Absolute 0.0 0.0 - 0.2 x10E3/uL   Immature Granulocytes 0 Not Estab. %   Immature Grans (Abs) 0.0 0.0 - 0.1 x10E3/uL  Comprehensive metabolic panel  Result Value Ref Range   Glucose 108 (H) 65 - 99 mg/dL   BUN 16 8 - 27 mg/dL   Creatinine, Ser 0.90 0.76 - 1.27 mg/dL   GFR calc non Af Amer 87 >59 mL/min/1.73   GFR calc Af Amer 100 >59 mL/min/1.73   BUN/Creatinine Ratio 18 10 - 24   Sodium 140 134 - 144 mmol/L   Potassium 4.1 3.5 - 5.2 mmol/L   Chloride 103 96 - 106 mmol/L   CO2 24 20 - 29 mmol/L   Calcium 9.8 8.6 - 10.2 mg/dL   Total Protein 7.0 6.0 - 8.5 g/dL   Albumin 4.8 3.6 - 4.8 g/dL   Globulin, Total 2.2 1.5 - 4.5 g/dL   Albumin/Globulin Ratio 2.2 1.2 - 2.2   Bilirubin Total 0.6 0.0 - 1.2 mg/dL   Alkaline Phosphatase 73 39 - 117 IU/L   AST 24 0 - 40 IU/L   ALT 25 0 - 44 IU/L  Lipid Panel w/o Chol/HDL Ratio  Result Value Ref Range    Cholesterol, Total 136 100 - 199 mg/dL   Triglycerides 58 0 - 149 mg/dL   HDL 63 >39 mg/dL   VLDL Cholesterol Cal 12 5 - 40 mg/dL   LDL Calculated 61 0 - 99 mg/dL  PSA  Result Value Ref Range   Prostate Specific Ag, Serum 0.9 0.0 - 4.0 ng/mL  TSH  Result Value Ref Range   TSH 0.885 0.450 - 4.500 uIU/mL      Assessment & Plan:   Problem List Items Addressed This Visit      Cardiovascular and Mediastinum   Hypertension    Chronic, ongoing. Initial BP elevated, but repeat below goal and home BP at goal.  Continue to monitor at home regularly and continue current medication regimen.  Labs today and refills sent.  Return in 6 months.      Relevant Medications   amLODipine (NORVASC) 10 MG tablet   atorvastatin (LIPITOR) 40 MG tablet   losartan (COZAAR) 50 MG tablet   Other Relevant Orders   CBC with Differential/Platelet   Comprehensive metabolic panel   CAD (coronary artery disease)    Continue daily ASA and Lipitor, adjust Lipitor dose as needed to meet goals and prevent stroke.      Relevant Medications   amLODipine (NORVASC) 10 MG tablet   atorvastatin (LIPITOR) 40 MG tablet   losartan (COZAAR) 50 MG tablet     Other   Hyperlipidemia    Chronic, ongoing.  Continue current medication regimen and adjust as needed.  Labs today.  Return in 6 months.      Relevant Medications   amLODipine (NORVASC) 10 MG tablet   atorvastatin (LIPITOR) 40 MG tablet   losartan (COZAAR) 50 MG tablet   Other Relevant Orders   Comprehensive metabolic panel   Lipid Panel w/o Chol/HDL Ratio   ED (erectile dysfunction)    Current medication regimen effective, refills sent.  Patient educated on appropriate amount of medication to take.      Raynaud phenomenon    No current issues, has given up diving which helps.  Continue to monitor and return for worsening symptoms.  Continue Amlodipine.       Other Visit  Diagnoses    Encounter for annual physical exam    -  Primary   Relevant Orders    PSA   TSH   Need for diphtheria-tetanus-pertussis (Tdap) vaccine       Flu vaccine need       Relevant Orders   Flu Vaccine QUAD High Dose(Fluad) (Completed)      Discussed aspirin prophylaxis for myocardial infarction prevention and decision was made to continue ASA  LABORATORY TESTING:  Health maintenance labs ordered today as discussed above.   The natural history of prostate cancer and ongoing controversy regarding screening and potential treatment outcomes of prostate cancer has been discussed with the patient. The meaning of a false positive PSA and a false negative PSA has been discussed. He indicates understanding of the limitations of this screening test and wishes to proceed with screening PSA testing.   IMMUNIZATIONS:   - Tdap: Tetanus vaccination status reviewed: Td vaccination indicated and given today. - Influenza: Up to date - Pneumovax: Up to date - Prevnar: Up to date - Zostavax vaccine: Up to date  SCREENING: - Colonoscopy: Up to date  Discussed with patient purpose of the colonoscopy is to detect colon cancer at curable precancerous or early stages   - AAA Screening: Not applicable  -Hearing Test: Not applicable  -Spirometry: Not applicable   PATIENT COUNSELING:    Sexuality: Discussed sexually transmitted diseases, partner selection, use of condoms, avoidance of unintended pregnancy  and contraceptive alternatives.   Advised to avoid cigarette smoking.  I discussed with the patient that most people either abstain from alcohol or drink within safe limits (<=14/week and <=4 drinks/occasion for males, <=7/weeks and <= 3 drinks/occasion for females) and that the risk for alcohol disorders and other health effects rises proportionally with the number of drinks per week and how often a drinker exceeds daily limits.  Discussed cessation/primary prevention of drug use and availability of treatment for abuse.   Diet: Encouraged to adjust caloric intake to  maintain  or achieve ideal body weight, to reduce intake of dietary saturated fat and total fat, to limit sodium intake by avoiding high sodium foods and not adding table salt, and to maintain adequate dietary potassium and calcium preferably from fresh fruits, vegetables, and low-fat dairy products.    stressed the importance of regular exercise  Injury prevention: Discussed safety belts, safety helmets, smoke detector, smoking near bedding or upholstery.   Dental health: Discussed importance of regular tooth brushing, flossing, and dental visits.   Follow up plan: NEXT PREVENTATIVE PHYSICAL DUE IN 1 YEAR. Return in about 6 months (around 09/03/2019) for HTN/HLD.

## 2019-03-07 LAB — CBC WITH DIFFERENTIAL/PLATELET
Basophils Absolute: 0.1 10*3/uL (ref 0.0–0.2)
Basos: 1 %
EOS (ABSOLUTE): 0 10*3/uL (ref 0.0–0.4)
Eos: 0 %
Hematocrit: 41 % (ref 37.5–51.0)
Hemoglobin: 14.2 g/dL (ref 13.0–17.7)
Immature Grans (Abs): 0 10*3/uL (ref 0.0–0.1)
Immature Granulocytes: 0 %
Lymphocytes Absolute: 1.4 10*3/uL (ref 0.7–3.1)
Lymphs: 18 %
MCH: 33.1 pg — ABNORMAL HIGH (ref 26.6–33.0)
MCHC: 34.6 g/dL (ref 31.5–35.7)
MCV: 96 fL (ref 79–97)
Monocytes Absolute: 0.9 10*3/uL (ref 0.1–0.9)
Monocytes: 12 %
Neutrophils Absolute: 5.3 10*3/uL (ref 1.4–7.0)
Neutrophils: 69 %
Platelets: 289 10*3/uL (ref 150–450)
RBC: 4.29 x10E6/uL (ref 4.14–5.80)
RDW: 13.2 % (ref 11.6–15.4)
WBC: 7.7 10*3/uL (ref 3.4–10.8)

## 2019-03-07 LAB — LIPID PANEL W/O CHOL/HDL RATIO
Cholesterol, Total: 169 mg/dL (ref 100–199)
HDL: 62 mg/dL (ref >39)
LDL Chol Calc (NIH): 86 mg/dL (ref 0–99)
Triglycerides: 122 mg/dL (ref 0–149)
VLDL Cholesterol Cal: 21 mg/dL (ref 5–40)

## 2019-03-07 LAB — COMPREHENSIVE METABOLIC PANEL
ALT: 40 IU/L (ref 0–44)
AST: 32 IU/L (ref 0–40)
Albumin/Globulin Ratio: 2.2 (ref 1.2–2.2)
Albumin: 5.1 g/dL — ABNORMAL HIGH (ref 3.8–4.8)
Alkaline Phosphatase: 90 IU/L (ref 39–117)
BUN/Creatinine Ratio: 16 (ref 10–24)
BUN: 18 mg/dL (ref 8–27)
Bilirubin Total: 0.6 mg/dL (ref 0.0–1.2)
CO2: 23 mmol/L (ref 20–29)
Calcium: 10 mg/dL (ref 8.6–10.2)
Chloride: 99 mmol/L (ref 96–106)
Creatinine, Ser: 1.13 mg/dL (ref 0.76–1.27)
GFR calc Af Amer: 76 mL/min/{1.73_m2} (ref >59)
GFR calc non Af Amer: 65 mL/min/{1.73_m2} (ref >59)
Globulin, Total: 2.3 g/dL (ref 1.5–4.5)
Glucose: 110 mg/dL — ABNORMAL HIGH (ref 65–99)
Potassium: 4.3 mmol/L (ref 3.5–5.2)
Sodium: 140 mmol/L (ref 134–144)
Total Protein: 7.4 g/dL (ref 6.0–8.5)

## 2019-03-07 LAB — PSA: Prostate Specific Ag, Serum: 0.9 ng/mL (ref 0.0–4.0)

## 2019-03-07 LAB — TSH: TSH: 1.91 u[IU]/mL (ref 0.450–4.500)

## 2019-03-07 NOTE — Progress Notes (Signed)
Normal test results noted.  Please call patient and make them aware of normal results and will continue to monitor at regular visits.  Have a great day.  Look forward to seeing you at your next visit.

## 2019-06-15 ENCOUNTER — Other Ambulatory Visit: Payer: Self-pay | Admitting: Nurse Practitioner

## 2019-07-05 ENCOUNTER — Other Ambulatory Visit: Payer: Self-pay

## 2019-07-05 ENCOUNTER — Ambulatory Visit: Payer: PRIVATE HEALTH INSURANCE | Admitting: Gastroenterology

## 2019-07-05 ENCOUNTER — Encounter: Payer: Self-pay | Admitting: Gastroenterology

## 2019-07-05 VITALS — BP 147/76 | HR 78 | Temp 98.0°F | Resp 17 | Ht 67.2 in | Wt 180.0 lb

## 2019-07-05 DIAGNOSIS — R1310 Dysphagia, unspecified: Secondary | ICD-10-CM | POA: Diagnosis not present

## 2019-07-05 DIAGNOSIS — R1319 Other dysphagia: Secondary | ICD-10-CM

## 2019-07-05 DIAGNOSIS — K222 Esophageal obstruction: Secondary | ICD-10-CM | POA: Diagnosis not present

## 2019-07-05 MED ORDER — OMEPRAZOLE 40 MG PO CPDR: 40.0000 mg | DELAYED_RELEASE_CAPSULE | Freq: Two times a day (BID) | ORAL | 2 refills | Status: DC

## 2019-07-05 NOTE — Progress Notes (Signed)
Cephas Darby, MD [address]  [place]  [place], [place] [address]  Main: [phone]  Fax: [phone]    Gastroenterology Consultation  Referring Provider:     Venita Lick, NP Primary Care Physician:  Venita Lick, NP Primary Gastroenterologist:  Dr. Cephas Darby Reason for Consultation: History of esophageal stricture, dysphagia        HPI:   Michael Zuniga is a 69 y.o. male referred by Dr. Venita Lick, NP  for consultation & management of dysphagia to solids.  Patient has known history of stricture of the lower esophagus, secondary to Schatzki's ring which was dilated in 02/2016.  Patient reports that for last several months, he has been experiencing worsening of difficulty swallowing to solids especially hard meats.  He had episodes of food getting stuck that he had to vomit.  He reports severe chest pressure whenever he has food impaction.  His weight has been stable.  He denies smoking or alcohol use.  He denies heartburn.  He is not on PPI.  He also has difficulty swallowing pills. He denies any lower GI symptoms  NSAIDs: None  Antiplts/Anticoagulants/Anti thrombotics: None  GI Procedures:  EGD and colonoscopy 03/02/2016 - A low-grade of narrowing Schatzki ring (acquired) was found at the gastroesophageal junction, 37 cm from the incisors. A TTS dilator was passed through the scope. Dilation with a 16-17-18 mm balloon dilator was performed to 18 mm. The dilation site was examined and showed improvement in luminal narrowing with self-limited oozing. Estimated blood loss was minimal. - A 3 cm hiatal hernia was present (37 cm - 40 cm from the incisors). - The entire examined stomach was normal. - The examined duodenum was normal.  - One 6 mm polyp in the ascending colon, removed with a cold snare. Resected and retrieved. - One 5 mm polyp in the descending colon, removed with a cold snare. Resected and retrieved. - One 2 mm polyp at the  recto-sigmoid colon, removed with a cold snare. Resected and retrieved. - Moderate diverticulosis from transverse colon to sigmoid colon. - Internal hemorrhoids.  Diagnosis 1. Surgical [P], ascending, descending, polyp (2) - MULTIPLE FRAGMENTS OF TUBULAR ADENOMA. - NO HIGH GRADE DYSPLASIA OR MALIGNANCY. 2. Surgical [P], recto-sigmoid, polyp - HYPERPLASTIC POLYP. - NO DYSPLASIA OR MALIGNANCY.  Past Medical History:  Diagnosis Date  . Dysphagia   . High blood pressure     Past Surgical History:  Procedure Laterality Date  . CORONARY ANGIOPLASTY WITH STENT PLACEMENT    . HERNIA REPAIR      Current Outpatient Medications:  .  amLODipine (NORVASC) 10 MG tablet, Take 1 tablet (10 mg total) by mouth daily., Disp: 90 tablet, Rfl: 3 .  aspirin EC 81 MG tablet, Take 81 mg by mouth daily., Disp: , Rfl:  .  atorvastatin (LIPITOR) 40 MG tablet, Take 1 tablet (40 mg total) by mouth daily., Disp: 90 tablet, Rfl: 3 .  losartan (COZAAR) 50 MG tablet, Take 1 tablet (50 mg total) by mouth daily., Disp: 90 tablet, Rfl: 3 .  sildenafil (REVATIO) 20 MG tablet, TAKE 1-5 TABLETS AS NEEDED, Disp: 30 tablet, Rfl: 0 .  omeprazole (PRILOSEC) 40 MG capsule, Take 1 capsule (40 mg total) by mouth 2 (two) times daily before a meal., Disp: 180 capsule, Rfl: 2  Current Facility-Administered Medications:  .  0.9 %  sodium chloride infusion, 500 mL, Intravenous, Continuous, Pyrtle, Lajuan Lines, MD    Family History  Problem Relation  Age of Onset  . Lung cancer Mother   . Lung cancer Father   . Thyroid disease Daughter   . Arthritis Maternal Grandmother   . Colon cancer Neg Hx      Social History   Tobacco Use  . Smoking status: Never Smoker  . Smokeless tobacco: Never Used  Substance Use Topics  . Alcohol use: No  . Drug use: No    Allergies as of 07/05/2019  . (No Known Allergies)    Review of Systems:    All systems reviewed and negative except where noted in HPI.   Physical Exam:  BP (!)  147/76 (BP Location: Left Arm, Patient Position: Sitting, Cuff Size: Large)   Pulse 78   Temp 98 F (36.7 C)   Resp 17   Ht 5' 7.2" (1.707 m)   Wt 180 lb (81.6 kg)   BMI 28.02 kg/m  No LMP for male patient.  General:   Alert,  Well-developed, well-nourished, pleasant and cooperative in NAD Head:  Normocephalic and atraumatic. Ears:  Normal auditory acuity. Lungs:  Respirations even and unlabored.  Clear throughout to auscultation.   No wheezes, crackles, or rhonchi. No acute distress. Heart:  Regular rate and rhythm; no murmurs, clicks, rubs, or gallops. Abdomen:  Normal bowel sounds. Soft, non-tender and non-distended without masses, hepatosplenomegaly or hernias noted.  No guarding or rebound tenderness.   Rectal: Not performed Msk:  Symmetrical without gross deformities. Good, equal movement & strength bilaterally. Pulses:  Normal pulses noted. Extremities:  No clubbing or edema.  No cyanosis. Neurologic:  Alert and oriented x3;  grossly normal neurologically. Skin:  Intact without significant lesions or rashes. No jaundice. Psych:  Alert and cooperative. Normal mood and affect.  Imaging Studies: Reviewed  Assessment and Plan:   [name] is a 69 y.o. male with history of hypertension, esophageal stenosis status post dilation in 2017 is seen in consultation for worsening of dysphagia to solids.  Recommend to start omeprazole 40 mg twice daily before meals Recommend EGD with dilation and esophageal biopsies to evaluate for eosinophilic esophagitis Recommend mechanical soft diet and finely chopped foods only  Surveillance colonoscopy Recommend surveillance colonoscopy in 02/2021  Follow up in 3 months   Cephas Darby, MD

## 2019-07-09 ENCOUNTER — Other Ambulatory Visit
Admission: RE | Admit: 2019-07-09 | Discharge: 2019-07-09 | Disposition: A | Discharge status: Home / Self Care | Payer: PRIVATE HEALTH INSURANCE | Source: Ambulatory Visit | Attending: Gastroenterology | Admitting: Gastroenterology

## 2019-07-09 ENCOUNTER — Other Ambulatory Visit: Payer: Self-pay

## 2019-07-09 DIAGNOSIS — Z01812 Encounter for preprocedural laboratory examination: Secondary | ICD-10-CM | POA: Insufficient documentation

## 2019-07-09 DIAGNOSIS — Z20822 Contact with and (suspected) exposure to covid-19: Secondary | ICD-10-CM | POA: Insufficient documentation

## 2019-07-09 LAB — SARS CORONAVIRUS 2 (TAT 6-24 HRS): SARS Coronavirus 2: NEGATIVE

## 2019-07-10 ENCOUNTER — Encounter: Payer: Self-pay | Admitting: Gastroenterology

## 2019-07-11 ENCOUNTER — Encounter
Admission: RE | Disposition: A | Discharge status: Home / Self Care | Payer: Self-pay | Source: Home / Self Care | Attending: Gastroenterology

## 2019-07-11 ENCOUNTER — Ambulatory Visit
Admission: RE | Admit: 2019-07-11 | Discharge: 2019-07-11 | Disposition: A | Discharge status: Home / Self Care | Payer: PRIVATE HEALTH INSURANCE | Attending: Gastroenterology | Admitting: Gastroenterology

## 2019-07-11 ENCOUNTER — Ambulatory Visit: Payer: PRIVATE HEALTH INSURANCE | Admitting: Certified Registered Nurse Anesthetist

## 2019-07-11 ENCOUNTER — Encounter: Payer: Self-pay | Admitting: *Deleted

## 2019-07-11 ENCOUNTER — Other Ambulatory Visit: Payer: Self-pay

## 2019-07-11 DIAGNOSIS — Z8261 Family history of arthritis: Secondary | ICD-10-CM | POA: Insufficient documentation

## 2019-07-11 DIAGNOSIS — K222 Esophageal obstruction: Secondary | ICD-10-CM

## 2019-07-11 DIAGNOSIS — Z955 Presence of coronary angioplasty implant and graft: Secondary | ICD-10-CM | POA: Insufficient documentation

## 2019-07-11 DIAGNOSIS — I10 Essential (primary) hypertension: Secondary | ICD-10-CM | POA: Insufficient documentation

## 2019-07-11 DIAGNOSIS — K449 Diaphragmatic hernia without obstruction or gangrene: Secondary | ICD-10-CM | POA: Diagnosis not present

## 2019-07-11 DIAGNOSIS — Z79899 Other long term (current) drug therapy: Secondary | ICD-10-CM | POA: Diagnosis not present

## 2019-07-11 DIAGNOSIS — G709 Myoneural disorder, unspecified: Secondary | ICD-10-CM | POA: Insufficient documentation

## 2019-07-11 DIAGNOSIS — I2510 Atherosclerotic heart disease of native coronary artery without angina pectoris: Secondary | ICD-10-CM | POA: Insufficient documentation

## 2019-07-11 DIAGNOSIS — K2100 Gastro-esophageal reflux disease with esophagitis, without bleeding: Secondary | ICD-10-CM

## 2019-07-11 DIAGNOSIS — R1314 Dysphagia, pharyngoesophageal phase: Secondary | ICD-10-CM | POA: Insufficient documentation

## 2019-07-11 DIAGNOSIS — Z7982 Long term (current) use of aspirin: Secondary | ICD-10-CM | POA: Diagnosis not present

## 2019-07-11 DIAGNOSIS — Z8349 Family history of other endocrine, nutritional and metabolic diseases: Secondary | ICD-10-CM | POA: Insufficient documentation

## 2019-07-11 DIAGNOSIS — R1319 Other dysphagia: Secondary | ICD-10-CM

## 2019-07-11 DIAGNOSIS — Z801 Family history of malignant neoplasm of trachea, bronchus and lung: Secondary | ICD-10-CM | POA: Diagnosis not present

## 2019-07-11 DIAGNOSIS — R1310 Dysphagia, unspecified: Secondary | ICD-10-CM | POA: Diagnosis not present

## 2019-07-11 HISTORY — PX: ESOPHAGOGASTRODUODENOSCOPY (EGD) WITH PROPOFOL: SHX5813

## 2019-07-11 HISTORY — DX: Atherosclerotic heart disease of native coronary artery without angina pectoris: I25.10

## 2019-07-11 SURGERY — ESOPHAGOGASTRODUODENOSCOPY (EGD) WITH PROPOFOL
Anesthesia: General

## 2019-07-11 MED ORDER — PROPOFOL 500 MG/50ML IV EMUL
INTRAVENOUS | Status: DC | PRN
  Administered 2019-07-11: 160 ug/kg/min via INTRAVENOUS

## 2019-07-11 MED ORDER — SODIUM CHLORIDE 0.9 % IV SOLN
INTRAVENOUS | Status: DC
  Administered 2019-07-11: 11:00:00 1000 mL via INTRAVENOUS

## 2019-07-11 MED ORDER — PROPOFOL 10 MG/ML IV BOLUS
INTRAVENOUS | Status: DC | PRN
  Administered 2019-07-11: 10 mg via INTRAVENOUS
  Administered 2019-07-11: 70 mg via INTRAVENOUS

## 2019-07-11 MED ORDER — LIDOCAINE HCL (CARDIAC) PF 100 MG/5ML IV SOSY
PREFILLED_SYRINGE | INTRAVENOUS | Status: DC | PRN
  Administered 2019-07-11: 100 mg via INTRATRACHEAL

## 2019-07-11 NOTE — H&P (Signed)
Michael Darby, MD [address]  [place]  [place], [place] [address]  Main: [phone]  Fax: [phone] Pager: [phone]  Primary Care Physician:  Venita Lick, NP Primary Gastroenterologist:  Dr. Cephas Zuniga  Pre-Procedure History & Physical: HPI:  Michael Zuniga is a 69 y.o. male is here for an endoscopy.   Past Medical History:  Diagnosis Date  . Coronary artery disease   . Dysphagia   . High blood pressure     Past Surgical History:  Procedure Laterality Date  . CORONARY ANGIOPLASTY WITH STENT PLACEMENT    . HERNIA REPAIR      Prior to Admission medications   Medication Sig Start Date End Date Taking? Authorizing Provider  amLODipine (NORVASC) 10 MG tablet Take 1 tablet (10 mg total) by mouth daily. 03/06/19  Yes Cannady, Henrine Screws T, NP  aspirin EC 81 MG tablet Take 81 mg by mouth daily.   Yes [provider]  atorvastatin (LIPITOR) 40 MG tablet Take 1 tablet (40 mg total) by mouth daily. 03/06/19  Yes Cannady, Jolene T, NP  losartan (COZAAR) 50 MG tablet Take 1 tablet (50 mg total) by mouth daily. 03/06/19  Yes Cannady, Henrine Screws T, NP  omeprazole (PRILOSEC) 40 MG capsule Take 1 capsule (40 mg total) by mouth 2 (two) times daily before a meal. 07/05/19 10/03/19 Yes Esiquio Boesen, Tally Due, MD  sildenafil (REVATIO) 20 MG tablet TAKE 1-5 TABLETS AS NEEDED 06/15/19  Yes Marnee Guarneri T, NP    Allergies as of 07/05/2019  . (No Known Allergies)    Family History  Problem Relation Age of Onset  . Lung cancer Mother   . Lung cancer Father   . Thyroid disease Daughter   . Arthritis Maternal Grandmother   . Colon cancer Neg Hx     Social History   Socioeconomic History  . Marital status: Married    Spouse name: Not on file  . Number of children: 2  . Years of education: Not on file  . Highest education level: Not on file  Occupational History  . Occupation: Glass blower/designer  Tobacco Use  . Smoking status: Never Smoker  . Smokeless tobacco: Never  Used  Substance and Sexual Activity  . Alcohol use: No  . Drug use: No  . Sexual activity: Yes  Other Topics Concern  . Not on file  Social History Narrative  . Not on file   Social Determinants of Health   Financial Resource Strain:   . Difficulty of Paying Living Expenses: Not on file  Food Insecurity:   . Worried About Charity fundraiser in the Last Year: Not on file  . Ran Out of Food in the Last Year: Not on file  Transportation Needs:   . Lack of Transportation (Medical): Not on file  . Lack of Transportation (Non-Medical): Not on file  Physical Activity:   . Days of Exercise per Week: Not on file  . Minutes of Exercise per Session: Not on file  Stress:   . Feeling of Stress : Not on file  Social Connections:   . Frequency of Communication with Friends and Family: Not on file  . Frequency of Social Gatherings with Friends and Family: Not on file  . Attends Religious Services: Not on file  . Active Member of Clubs or Organizations: Not on file  . Attends Archivist Meetings: Not on file  . Marital Status: Not on file  Intimate Partner Violence:   .  Fear of Current or Ex-Partner: Not on file  . Emotionally Abused: Not on file  . Physically Abused: Not on file  . Sexually Abused: Not on file    Review of Systems: See HPI, otherwise negative ROS  Physical Exam: BP (!) 149/82   Pulse 98   Temp (!) 97.3 F (36.3 C) (Temporal)   Resp 20   Ht 5' 9.5" (1.765 m)   Wt 79.4 kg   SpO2 100%   BMI 25.47 kg/m  General:   Alert,  pleasant and cooperative in NAD Head:  Normocephalic and atraumatic. Neck:  Supple; no masses or thyromegaly. Lungs:  Clear throughout to auscultation.    Heart:  Regular rate and rhythm. Abdomen:  Soft, nontender and nondistended. Normal bowel sounds, without guarding, and without rebound.   Neurologic:  Alert and  oriented x4;  grossly normal neurologically.  Impression/Plan: Michael Zuniga is here for an endoscopy to be  performed for dysphagia, h/o esophageal stricture  Risks, benefits, limitations, and alternatives regarding  endoscopy have been reviewed with the patient.  Questions have been answered.  All parties agreeable.   Sherri Sear, MD  07/11/2019, 11:28 AM

## 2019-07-11 NOTE — Anesthesia Preprocedure Evaluation (Signed)
Anesthesia Evaluation  Patient identified by MRN, date of birth, ID band Patient awake    Reviewed: Allergy & Precautions, H&P , NPO status , Patient's Chart, lab work & pertinent test results, reviewed documented beta blocker date and time   Airway Mallampati: II   Neck ROM: full    Dental  (+) Poor Dentition   Pulmonary neg pulmonary ROS,    Pulmonary exam normal        Cardiovascular Exercise Tolerance: Good hypertension, On Medications + CAD  Normal cardiovascular exam Rhythm:regular Rate:Normal     Neuro/Psych  Neuromuscular disease negative psych ROS   GI/Hepatic negative GI ROS, Neg liver ROS,   Endo/Other  negative endocrine ROS  Renal/GU negative Renal ROS  negative genitourinary   Musculoskeletal   Abdominal   Peds  Hematology negative hematology ROS (+)   Anesthesia Other Findings Past Medical History: No date: Coronary artery disease No date: Dysphagia No date: High blood pressure Past Surgical History: No date: CORONARY ANGIOPLASTY WITH STENT PLACEMENT No date: HERNIA REPAIR BMI    Body Mass Index: 25.47 kg/m     Reproductive/Obstetrics negative OB ROS                             Anesthesia Physical Anesthesia Plan  ASA: III  Anesthesia Plan: General   Post-op Pain Management:    Induction:   PONV Risk Score and Plan:   Airway Management Planned:   Additional Equipment:   Intra-op Plan:   Post-operative Plan:   Informed Consent: I have reviewed the patients History and Physical, chart, labs and discussed the procedure including the risks, benefits and alternatives for the proposed anesthesia with the patient or authorized representative who has indicated his/her understanding and acceptance.     Dental Advisory Given  Plan Discussed with: CRNA  Anesthesia Plan Comments:         Anesthesia Quick Evaluation

## 2019-07-11 NOTE — Transfer of Care (Signed)
Immediate Anesthesia Transfer of Care Note  Patient: Michael Zuniga  Procedure(s) Performed: ESOPHAGOGASTRODUODENOSCOPY (EGD) WITH PROPOFOL (N/A )  Patient Location: PACU  Anesthesia Type:General  Level of Consciousness: drowsy  Airway & Oxygen Therapy: Patient Spontanous Breathing and Patient connected to nasal cannula oxygen  Post-op Assessment: Report given to RN and Post -op Vital signs reviewed and stable  Post vital signs: Reviewed and stable  Last Vitals:  Vitals Value Taken Time  BP 91/54 07/11/19 1151  Temp 36.2 C 07/11/19 1150  Pulse 58 07/11/19 1151  Resp 18 07/11/19 1151  SpO2 95 % 07/11/19 1151  Vitals shown include unvalidated device data.  Last Pain:  Vitals:   07/11/19 1150  TempSrc: Temporal  PainSc:          Complications: No apparent anesthesia complications

## 2019-07-11 NOTE — Anesthesia Procedure Notes (Signed)
Performed by: Demetrius Charity, CRNA Pre-anesthesia Checklist: Patient identified, Emergency Drugs available, Patient being monitored, Timeout performed and Suction available Patient Re-evaluated:Patient Re-evaluated prior to induction Oxygen Delivery Method: Nasal cannula Induction Type: IV induction

## 2019-07-11 NOTE — Op Note (Signed)
[facility] Gastroenterology Patient Name: Michael Zuniga Procedure Date: 07/11/2019 11:25 AM MRN: [id number] Account #: [id number] Date of Birth: 04-16-1951 Admit Type: Outpatient Age: 69 Room: [facility] ENDO ROOM 2 Gender: Male Note Status: Finalized Procedure:             Upper GI endoscopy Indications:           Esophageal dysphagia, Stenosis of the esophagus, For                         therapy of esophageal stenosis Providers:             Lin Landsman MD, MD Medicines:             Monitored Anesthesia Care Complications:         No immediate complications. Estimated blood loss: None. Procedure:             Pre-Anesthesia Assessment:                        - Prior to the procedure, a History and Physical was                         performed, and patient medications and allergies were                         reviewed. The patient is competent. The risks and                         benefits of the procedure and the sedation options and                         risks were discussed with the patient. All questions                         were answered and informed consent was obtained.                         Patient identification and proposed procedure were                         verified by the physician, the nurse, the                         anesthesiologist, the anesthetist and the technician                         in the pre-procedure area in the procedure room in the                         endoscopy suite. Mental Status Examination: alert and                         oriented. Airway Examination: normal oropharyngeal                         airway and neck mobility. Respiratory Examination:                         clear to auscultation. CV  Examination: normal.                         Prophylactic Antibiotics: The patient does not require                         prophylactic antibiotics. Prior Anticoagulants: The                         patient has taken no  previous anticoagulant or                         antiplatelet agents. ASA Grade Assessment: III - A                         patient with severe systemic disease. After reviewing                         the risks and benefits, the patient was deemed in                         satisfactory condition to undergo the procedure. The                         anesthesia plan was to use monitored anesthesia care                         (MAC). Immediately prior to administration of                         medications, the patient was re-assessed for adequacy                         to receive sedatives. The heart rate, respiratory                         rate, oxygen saturations, blood pressure, adequacy of                         pulmonary ventilation, and response to care were                         monitored throughout the procedure. The physical                         status of the patient was re-assessed after the                         procedure.                        After obtaining informed consent, the endoscope was                         passed under direct vision. Throughout the procedure,                         the patient's blood pressure, pulse, and oxygen  saturations were monitored continuously. The Endoscope                         was introduced through the mouth, and advanced to the                         second part of duodenum. The upper GI endoscopy was                         accomplished without difficulty. The patient tolerated                         the procedure well. Findings:      The duodenal bulb and second portion of the duodenum were normal.      A small hiatal hernia was present.      LA Grade A (one or more mucosal breaks less than 5 mm, not extending       between tops of 2 mucosal folds) esophagitis with no bleeding was found       in the lower third of the esophagus.      A low-grade of narrowing Schatzki ring was found at the  gastroesophageal       junction. A TTS dilator was passed through the scope. Dilation with a       04-08-11 mm balloon and a 15-16.5-18 mm balloon dilator was performed to       16.5 mm. The dilation site was examined following endoscope reinsertion       and showed mild mucosal disruption and moderate improvement in luminal       narrowing. Estimated blood loss was minimal. Impression:            - Normal duodenal bulb and second portion of the                         duodenum.                        - Small hiatal hernia.                        - LA Grade A reflux esophagitis with no bleeding.                        - Low-grade of narrowing Schatzki ring. Dilated.                        - No specimens collected. Recommendation:        - Discharge patient to home (with escort).                        - Resume previous diet today.                        - Continue present medications.                        - Follow an antireflux regimen.                        - Use Prilosec (omeprazole) 40 mg PO BID for 3 months  then daily.                        - Return to my office as previously scheduled.                        - Repeat upper endoscopy PRN for retreatment. Procedure Code(s):     --- Professional ---                        [phone], Esophagogastroduodenoscopy, flexible,                         transoral; with transendoscopic balloon dilation of                         esophagus (less than 30 mm diameter) Diagnosis Code(s):     --- Professional ---                        K44.9, Diaphragmatic hernia without obstruction or                         gangrene                        K21.00, Gastro-esophageal reflux disease with                         esophagitis, without bleeding                        K22.2, Esophageal obstruction                        R13.14, Dysphagia, pharyngoesophageal phase CPT copyright 2019 American Medical Association. All rights  reserved. The codes documented in this report are preliminary and upon coder review may  be revised to meet current compliance requirements. Dr. Ulyess Mort Lin Landsman MD, MD 07/11/2019 11:50:30 AM This report has been signed electronically. Number of Addenda: 0 Note Initiated On: 07/11/2019 11:25 AM Estimated Blood Loss:  Estimated blood loss: none.      [facility]

## 2019-07-12 NOTE — Anesthesia Postprocedure Evaluation (Signed)
Anesthesia Post Note  Patient: Hillman Rudisill Mecham  Procedure(s) Performed: ESOPHAGOGASTRODUODENOSCOPY (EGD) WITH PROPOFOL (N/A )  Patient location during evaluation: PACU Anesthesia Type: General Level of consciousness: awake and alert Pain management: pain level controlled Vital Signs Assessment: post-procedure vital signs reviewed and stable Respiratory status: spontaneous breathing, nonlabored ventilation, respiratory function stable and patient connected to nasal cannula oxygen Cardiovascular status: blood pressure returned to baseline and stable Postop Assessment: no apparent nausea or vomiting Anesthetic complications: no     Last Vitals:  Vitals:   07/11/19 1042 07/11/19 1150  BP: (!) 149/82 (!) 91/54  Pulse: 98   Resp: 20   Temp: (!) 36.3 C (!) 36.2 C  SpO2: 100%     Last Pain:  Vitals:   07/12/19 0752  TempSrc:   PainSc: 0-No pain                 Molli Barrows

## 2019-09-03 ENCOUNTER — Encounter: Payer: Self-pay | Admitting: Nurse Practitioner

## 2019-09-03 ENCOUNTER — Ambulatory Visit: Payer: PRIVATE HEALTH INSURANCE | Admitting: Nurse Practitioner

## 2019-09-03 ENCOUNTER — Other Ambulatory Visit: Payer: Self-pay

## 2019-09-03 VITALS — BP 131/73 | HR 69 | Temp 98.9°F

## 2019-09-03 DIAGNOSIS — E7800 Pure hypercholesterolemia, unspecified: Secondary | ICD-10-CM

## 2019-09-03 DIAGNOSIS — I10 Essential (primary) hypertension: Secondary | ICD-10-CM | POA: Diagnosis not present

## 2019-09-03 NOTE — Patient Instructions (Signed)
DASH Eating Plan DASH stands for "Dietary Approaches to Stop Hypertension." The DASH eating plan is a healthy eating plan that has been shown to reduce high blood pressure (hypertension). It may also reduce your risk for type 2 diabetes, heart disease, and stroke. The DASH eating plan may also help with weight loss. What are tips for following this plan?  General guidelines  Avoid eating more than 2,300 mg (milligrams) of salt (sodium) a day. If you have hypertension, you may need to reduce your sodium intake to 1,500 mg a day.  Limit alcohol intake to no more than 1 drink a day for nonpregnant women and 2 drinks a day for men. One drink equals 12 oz of beer, 5 oz of wine, or 1 oz of hard liquor.  Work with your health care provider to maintain a healthy body weight or to lose weight. Ask what an ideal weight is for you.  Get at least 30 minutes of exercise that causes your heart to beat faster (aerobic exercise) most days of the week. Activities may include walking, swimming, or biking.  Work with your health care provider or diet and nutrition specialist (dietitian) to adjust your eating plan to your individual calorie needs. Reading food labels   Check food labels for the amount of sodium per serving. Choose foods with less than 5 percent of the Daily Value of sodium. Generally, foods with less than 300 mg of sodium per serving fit into this eating plan.  To find whole grains, look for the word "whole" as the first word in the ingredient list. Shopping  Buy products labeled as "low-sodium" or "no salt added."  Buy fresh foods. Avoid canned foods and premade or frozen meals. Cooking  Avoid adding salt when cooking. Use salt-free seasonings or herbs instead of table salt or sea salt. Check with your health care provider or pharmacist before using salt substitutes.  Do not fry foods. Cook foods using healthy methods such as baking, boiling, grilling, and broiling instead.  Cook with  heart-healthy oils, such as olive, canola, soybean, or sunflower oil. Meal planning  Eat a balanced diet that includes: ? 5 or more servings of fruits and vegetables each day. At each meal, try to fill half of your plate with fruits and vegetables. ? Up to 6-8 servings of whole grains each day. ? Less than 6 oz of lean meat, poultry, or fish each day. A 3-oz serving of meat is about the same size as a deck of cards. One egg equals 1 oz. ? 2 servings of low-fat dairy each day. ? A serving of nuts, seeds, or beans 5 times each week. ? Heart-healthy fats. Healthy fats called Omega-3 fatty acids are found in foods such as flaxseeds and coldwater fish, like sardines, salmon, and mackerel.  Limit how much you eat of the following: ? Canned or prepackaged foods. ? Food that is high in trans fat, such as fried foods. ? Food that is high in saturated fat, such as fatty meat. ? Sweets, desserts, sugary drinks, and other foods with added sugar. ? Full-fat dairy products.  Do not salt foods before eating.  Try to eat at least 2 vegetarian meals each week.  Eat more home-cooked food and less restaurant, buffet, and fast food.  When eating at a restaurant, ask that your food be prepared with less salt or no salt, if possible. What foods are recommended? The items listed may not be a complete list. Talk with your dietitian about   what dietary choices are best for you. Grains Whole-grain or whole-wheat bread. Whole-grain or whole-wheat pasta. Brown rice. Oatmeal. Quinoa. Bulgur. Whole-grain and low-sodium cereals. Pita bread. Low-fat, low-sodium crackers. Whole-wheat flour tortillas. Vegetables Fresh or frozen vegetables (raw, steamed, roasted, or grilled). Low-sodium or reduced-sodium tomato and vegetable juice. Low-sodium or reduced-sodium tomato sauce and tomato paste. Low-sodium or reduced-sodium canned vegetables. Fruits All fresh, dried, or frozen fruit. Canned fruit in natural juice (without  added sugar). Meat and other protein foods Skinless chicken or turkey. Ground chicken or turkey. Pork with fat trimmed off. Fish and seafood. Egg whites. Dried beans, peas, or lentils. Unsalted nuts, nut butters, and seeds. Unsalted canned beans. Lean cuts of beef with fat trimmed off. Low-sodium, lean deli meat. Dairy Low-fat (1%) or fat-free (skim) milk. Fat-free, low-fat, or reduced-fat cheeses. Nonfat, low-sodium ricotta or cottage cheese. Low-fat or nonfat yogurt. Low-fat, low-sodium cheese. Fats and oils Soft margarine without trans fats. Vegetable oil. Low-fat, reduced-fat, or light mayonnaise and salad dressings (reduced-sodium). Canola, safflower, olive, soybean, and sunflower oils. Avocado. Seasoning and other foods Herbs. Spices. Seasoning mixes without salt. Unsalted popcorn and pretzels. Fat-free sweets. What foods are not recommended? The items listed may not be a complete list. Talk with your dietitian about what dietary choices are best for you. Grains Baked goods made with fat, such as croissants, muffins, or some breads. Dry pasta or rice meal packs. Vegetables Creamed or fried vegetables. Vegetables in a cheese sauce. Regular canned vegetables (not low-sodium or reduced-sodium). Regular canned tomato sauce and paste (not low-sodium or reduced-sodium). Regular tomato and vegetable juice (not low-sodium or reduced-sodium). Pickles. Olives. Fruits Canned fruit in a light or heavy syrup. Fried fruit. Fruit in cream or butter sauce. Meat and other protein foods Fatty cuts of meat. Ribs. Fried meat. Bacon. Sausage. Bologna and other processed lunch meats. Salami. Fatback. Hotdogs. Bratwurst. Salted nuts and seeds. Canned beans with added salt. Canned or smoked fish. Whole eggs or egg yolks. Chicken or turkey with skin. Dairy Whole or 2% milk, cream, and half-and-half. Whole or full-fat cream cheese. Whole-fat or sweetened yogurt. Full-fat cheese. Nondairy creamers. Whipped toppings.  Processed cheese and cheese spreads. Fats and oils Butter. Stick margarine. Lard. Shortening. Ghee. Bacon fat. Tropical oils, such as coconut, palm kernel, or palm oil. Seasoning and other foods Salted popcorn and pretzels. Onion salt, garlic salt, seasoned salt, table salt, and sea salt. Worcestershire sauce. Tartar sauce. Barbecue sauce. Teriyaki sauce. Soy sauce, including reduced-sodium. Steak sauce. Canned and packaged gravies. Fish sauce. Oyster sauce. Cocktail sauce. Horseradish that you find on the shelf. Ketchup. Mustard. Meat flavorings and tenderizers. Bouillon cubes. Hot sauce and Tabasco sauce. Premade or packaged marinades. Premade or packaged taco seasonings. Relishes. Regular salad dressings. Where to find more information:  National Heart, Lung, and Blood Institute: www.nhlbi.nih.gov  American Heart Association: www.heart.org Summary  The DASH eating plan is a healthy eating plan that has been shown to reduce high blood pressure (hypertension). It may also reduce your risk for type 2 diabetes, heart disease, and stroke.  With the DASH eating plan, you should limit salt (sodium) intake to 2,300 mg a day. If you have hypertension, you may need to reduce your sodium intake to 1,500 mg a day.  When on the DASH eating plan, aim to eat more fresh fruits and vegetables, whole grains, lean proteins, low-fat dairy, and heart-healthy fats.  Work with your health care provider or diet and nutrition specialist (dietitian) to adjust your eating plan to your   individual calorie needs. This information is not intended to replace advice given to you by your health care provider. Make sure you discuss any questions you have with your health care provider. Document Revised: 05/27/2017 Document Reviewed: 06/07/2016 Elsevier Patient Education  2020 Elsevier Inc.  

## 2019-09-03 NOTE — Assessment & Plan Note (Signed)
Chronic, ongoing.  Continue current medication regimen and adjust as needed.  Labs today.  Return in 6 months. 

## 2019-09-03 NOTE — Progress Notes (Signed)
BP 131/73   Pulse 69   Temp 98.9 F (37.2 C) (Oral)   SpO2 97%    Subjective:    Patient ID: Michael Zuniga, male    DOB: 05-Jan-1951, 69 y.o.   MRN: [id number]  HPI: Michael Zuniga is a 69 y.o. male  Chief Complaint  Patient presents with  . Hyperlipidemia  . Hypertension   HYPERTENSION / HYPERLIPIDEMIA Continues on Amlodipine, Losartan, ASA, and Atorvastatin. Satisfied with current treatment? yes Duration of hypertension: chronic BP monitoring frequency: rarely BP range: 130/70's at home BP medication side effects: no Duration of hyperlipidemia: chronic Cholesterol medication side effects: no Cholesterol supplements: none Medication compliance: good compliance Aspirin: yes Recent stressors: no Recurrent headaches: no Visual changes: no Palpitations: no Dyspnea: no Chest pain: no Lower extremity edema: no Dizzy/lightheaded: no  Relevant past medical, surgical, family and social history reviewed and updated as indicated. Interim medical history since our last visit reviewed. Allergies and medications reviewed and updated.  Review of Systems  Constitutional: Negative for activity change, diaphoresis, fatigue and fever.  Respiratory: Negative for cough, chest tightness, shortness of breath and wheezing.   Cardiovascular: Negative for chest pain, palpitations and leg swelling.  Gastrointestinal: Negative.   Neurological: Negative.   Psychiatric/Behavioral: Negative.     Per HPI unless specifically indicated above     Objective:    BP 131/73   Pulse 69   Temp 98.9 F (37.2 C) (Oral)   SpO2 97%   Wt Readings from Last 3 Encounters:  07/11/19 175 lb (79.4 kg)  07/05/19 180 lb (81.6 kg)  03/06/19 178 lb (80.7 kg)    Physical Exam Vitals and nursing note reviewed.  Constitutional:      General: He is awake. He is not in acute distress.    Appearance: He is well-developed and well-groomed. He is not ill-appearing.  HENT:     Head: Normocephalic and  atraumatic.     Right Ear: Hearing normal. No drainage.     Left Ear: Hearing normal. No drainage.  Eyes:     General: Lids are normal.        Right eye: No discharge.        Left eye: No discharge.     Conjunctiva/sclera: Conjunctivae normal.     Pupils: Pupils are equal, round, and reactive to light.  Neck:     Vascular: No carotid bruit.  Cardiovascular:     Rate and Rhythm: Normal rate and regular rhythm.     Heart sounds: Normal heart sounds, S1 normal and S2 normal. No murmur. No gallop.   Pulmonary:     Effort: Pulmonary effort is normal. No accessory muscle usage or respiratory distress.     Breath sounds: Normal breath sounds.  Abdominal:     General: Bowel sounds are normal.     Palpations: Abdomen is soft.  Musculoskeletal:        General: Normal range of motion.     Cervical back: Normal range of motion and neck supple.     Right lower leg: No edema.     Left lower leg: No edema.  Skin:    General: Skin is warm and dry.  Neurological:     Mental Status: He is alert and oriented to person, place, and time.  Psychiatric:        Attention and Perception: Attention normal.        Mood and Affect: Mood normal.        Speech: Speech  normal.        Behavior: Behavior normal. Behavior is cooperative.    Results for orders placed or performed during the hospital encounter of 07/09/19  SARS CORONAVIRUS 2 (TAT 6-24 HRS) Nasopharyngeal Nasopharyngeal Swab   Specimen: Nasopharyngeal Swab  Result Value Ref Range   SARS Coronavirus 2 NEGATIVE NEGATIVE      Assessment & Plan:   Problem List Items Addressed This Visit      Cardiovascular and Mediastinum   Hypertension - Primary    Chronic, ongoing. BP at goal in office and home BP at goal.  Continue to monitor at home regularly and continue current medication regimen, adjust as needed.  Labs today.  Return in 6 months for annual physical.      Relevant Orders   Basic metabolic panel     Other   Hyperlipidemia     Chronic, ongoing.  Continue current medication regimen and adjust as needed.  Labs today.  Return in 6 months.      Relevant Orders   Lipid Panel w/o Chol/HDL Ratio       Follow up plan: Return in about 6 months (around 03/05/2020) for Annual physical.

## 2019-09-03 NOTE — Assessment & Plan Note (Addendum)
Chronic, ongoing. BP at goal in office and home BP at goal.  Continue to monitor at home regularly and continue current medication regimen, adjust as needed.  Labs today.  Return in 6 months for annual physical.

## 2019-09-04 LAB — BASIC METABOLIC PANEL
BUN/Creatinine Ratio: 16 (ref 10–24)
BUN: 17 mg/dL (ref 8–27)
CO2: 21 mmol/L (ref 20–29)
Calcium: 10.3 mg/dL — ABNORMAL HIGH (ref 8.6–10.2)
Chloride: 102 mmol/L (ref 96–106)
Creatinine, Ser: 1.06 mg/dL (ref 0.76–1.27)
GFR calc Af Amer: 81 mL/min/{1.73_m2} (ref >59)
GFR calc non Af Amer: 70 mL/min/{1.73_m2} (ref >59)
Glucose: 102 mg/dL — ABNORMAL HIGH (ref 65–99)
Potassium: 4.2 mmol/L (ref 3.5–5.2)
Sodium: 140 mmol/L (ref 134–144)

## 2019-09-04 LAB — LIPID PANEL W/O CHOL/HDL RATIO
Cholesterol, Total: 152 mg/dL (ref 100–199)
HDL: 87 mg/dL (ref >39)
LDL Chol Calc (NIH): 55 mg/dL (ref 0–99)
Triglycerides: 44 mg/dL (ref 0–149)
VLDL Cholesterol Cal: 10 mg/dL (ref 5–40)

## 2019-09-04 NOTE — Progress Notes (Signed)
Please let Michael Zuniga know via letter or phone -- Labs are normal, kidney and liver function normal.  Cholesterol levels are at goal.  Have a great day and continue current medications.

## 2019-09-15 ENCOUNTER — Other Ambulatory Visit: Payer: Self-pay | Admitting: Nurse Practitioner

## 2019-09-15 NOTE — Telephone Encounter (Signed)
Requested Prescriptions  Pending Prescriptions Disp Refills  . sildenafil (REVATIO) 20 MG tablet [Pharmacy Med Name: SILDENAFIL 20 MG TABLET] 30 tablet 0    Sig: TAKE 1-5 TABLETS AS NEEDED     Urology: Erectile Dysfunction Agents Passed - 09/15/2019  9:47 AM      Passed - Last BP in normal range    BP Readings from Last 1 Encounters:  09/03/19 131/73         Passed - Valid encounter within last 12 months    Recent Outpatient Visits          1 week ago Essential hypertension   [place], Henrine Screws T, NP   6 months ago Encounter for annual physical exam   [facility], Henrine Screws T, NP   1 year ago Essential hypertension   [facility] Kathrine Haddock, NP   1 year ago Annual physical exam   [facility] Kathrine Haddock, NP   2 years ago Essential hypertension   [facility] Kathrine Haddock, NP      Future Appointments            In 5 months Cann[place], Barbaraann Faster, NP [facility]

## 2019-10-21 ENCOUNTER — Other Ambulatory Visit: Payer: Self-pay | Admitting: Nurse Practitioner

## 2019-10-21 NOTE — Telephone Encounter (Signed)
Requested Prescriptions  Pending Prescriptions Disp Refills  . sildenafil (REVATIO) 20 MG tablet [Pharmacy Med Name: SILDENAFIL 20 MG TABLET] 90 tablet 2    Sig: TAKE 1-5 TABLETS AS NEEDED     Urology: Erectile Dysfunction Agents Passed - 10/21/2019  9:52 AM      Passed - Last BP in normal range    BP Readings from Last 1 Encounters:  09/03/19 131/73         Passed - Valid encounter within last 12 months    Recent Outpatient Visits          1 month ago Essential hypertension   [place], Barbaraann Faster, NP   7 months ago Encounter for annual physical exam   [place], Henrine Screws T, NP   1 year ago Essential hypertension   [place] Kathrine Haddock, NP   2 years ago Annual physical exam   [facility] Kathrine Haddock, NP   2 years ago Essential hypertension   [place] Kathrine Haddock, NP      Future Appointments            In 4 months Cannady, Barbaraann Faster, NP [facility]

## 2019-12-25 ENCOUNTER — Ambulatory Visit: Payer: Self-pay | Admitting: Podiatry

## 2020-03-05 ENCOUNTER — Encounter: Payer: PRIVATE HEALTH INSURANCE | Admitting: Nurse Practitioner

## 2020-03-11 ENCOUNTER — Telehealth: Payer: Self-pay | Admitting: Nurse Practitioner

## 2020-03-11 NOTE — Telephone Encounter (Signed)
Called pt to r/s 03/17/20 appointment, no answer, left vm

## 2020-03-17 ENCOUNTER — Encounter: Payer: PRIVATE HEALTH INSURANCE | Admitting: Nurse Practitioner

## 2020-03-18 ENCOUNTER — Other Ambulatory Visit: Payer: Self-pay | Admitting: Nurse Practitioner

## 2020-03-18 NOTE — Telephone Encounter (Signed)
Requested Prescriptions  Pending Prescriptions Disp Refills  . losartan (COZAAR) 50 MG tablet [Pharmacy Med Name: LOSARTAN POTASSIUM 50 MG TAB] 90 tablet 0    Sig: TAKE 1 TABLET BY MOUTH EVERY DAY     Cardiovascular:  Angiotensin Receptor Blockers Failed - 03/18/2020 11:35 AM      Failed - Cr in normal range and within 180 days    Creatinine, Ser  Date Value Ref Range Status  09/03/2019 1.06 0.76 - 1.27 mg/dL Final         Failed - K in normal range and within 180 days    Potassium  Date Value Ref Range Status  09/03/2019 4.2 3.5 - 5.2 mmol/L Final         Failed - Valid encounter within last 6 months    Recent Outpatient Visits          6 months ago Essential hypertension   [place], Henrine Screws T, NP   1 year ago Encounter for annual physical exam   [place], Henrine Screws T, NP   1 year ago Essential hypertension   [facility] Kathrine Haddock, NP   2 years ago Annual physical exam   [facility] Kathrine Haddock, NP   2 years ago Essential hypertension   [place], [place], NP      Future Appointments            In 4 weeks Cannady, Barbaraann Faster, NP [employer] [facility], [place] - Patient is not pregnant      Passed - Last BP in normal range    BP Readings from Last 1 Encounters:  09/03/19 131/73

## 2020-04-16 ENCOUNTER — Other Ambulatory Visit: Payer: Self-pay

## 2020-04-16 ENCOUNTER — Encounter: Payer: Self-pay | Admitting: Nurse Practitioner

## 2020-04-16 ENCOUNTER — Ambulatory Visit (INDEPENDENT_AMBULATORY_CARE_PROVIDER_SITE_OTHER): Payer: PRIVATE HEALTH INSURANCE | Admitting: Nurse Practitioner

## 2020-04-16 VITALS — BP 130/70 | HR 79 | Temp 98.6°F | Ht 67.0 in | Wt 173.0 lb

## 2020-04-16 DIAGNOSIS — Z23 Encounter for immunization: Secondary | ICD-10-CM | POA: Diagnosis not present

## 2020-04-16 DIAGNOSIS — I7300 Raynaud's syndrome without gangrene: Secondary | ICD-10-CM | POA: Diagnosis not present

## 2020-04-16 DIAGNOSIS — Z0000 Encounter for general adult medical examination without abnormal findings: Secondary | ICD-10-CM

## 2020-04-16 DIAGNOSIS — K2100 Gastro-esophageal reflux disease with esophagitis, without bleeding: Secondary | ICD-10-CM | POA: Diagnosis not present

## 2020-04-16 DIAGNOSIS — N521 Erectile dysfunction due to diseases classified elsewhere: Secondary | ICD-10-CM

## 2020-04-16 DIAGNOSIS — E7800 Pure hypercholesterolemia, unspecified: Secondary | ICD-10-CM | POA: Diagnosis not present

## 2020-04-16 DIAGNOSIS — I2510 Atherosclerotic heart disease of native coronary artery without angina pectoris: Secondary | ICD-10-CM | POA: Diagnosis not present

## 2020-04-16 DIAGNOSIS — I10 Essential (primary) hypertension: Secondary | ICD-10-CM

## 2020-04-16 DIAGNOSIS — Z125 Encounter for screening for malignant neoplasm of prostate: Secondary | ICD-10-CM

## 2020-04-16 MED ORDER — AMLODIPINE BESYLATE 10 MG PO TABS: 10.0000 mg | ORAL_TABLET | Freq: Every day | ORAL | 4 refills | Status: DC

## 2020-04-16 MED ORDER — ATORVASTATIN CALCIUM 40 MG PO TABS: 40.0000 mg | ORAL_TABLET | Freq: Every day | ORAL | 4 refills | Status: DC

## 2020-04-16 MED ORDER — LOSARTAN POTASSIUM 50 MG PO TABS: 50.0000 mg | ORAL_TABLET | Freq: Every day | ORAL | 4 refills | Status: DC

## 2020-04-16 NOTE — Assessment & Plan Note (Signed)
Chronic, ongoing.  Continue PPI as ordered by GI and collaboration with GI team.

## 2020-04-16 NOTE — Assessment & Plan Note (Signed)
Chronic, ongoing. BP at goal in office initially elevated, but repeat at goal and home BP at goal.  Continue to monitor at home regularly and continue current medication regimen, adjust as needed.  Labs today.  DASH diet focus.  Return in 6 months for follow-up.

## 2020-04-16 NOTE — Assessment & Plan Note (Signed)
Continue daily ASA and Lipitor, adjust Lipitor dose as needed to meet goals and prevent stroke. °

## 2020-04-16 NOTE — Assessment & Plan Note (Signed)
No current issues, has given up diving which helps.  Continue to monitor and return for worsening symptoms.  Continue Amlodipine.

## 2020-04-16 NOTE — Patient Instructions (Signed)
DASH Eating Plan DASH stands for "Dietary Approaches to Stop Hypertension." The DASH eating plan is a healthy eating plan that has been shown to reduce high blood pressure (hypertension). It may also reduce your risk for type 2 diabetes, heart disease, and stroke. The DASH eating plan may also help with weight loss. What are tips for following this plan?  General guidelines  Avoid eating more than 2,300 mg (milligrams) of salt (sodium) a day. If you have hypertension, you may need to reduce your sodium intake to 1,500 mg a day.  Limit alcohol intake to no more than 1 drink a day for nonpregnant women and 2 drinks a day for men. One drink equals 12 oz of beer, 5 oz of wine, or 1 oz of hard liquor.  Work with your health care provider to maintain a healthy body weight or to lose weight. Ask what an ideal weight is for you.  Get at least 30 minutes of exercise that causes your heart to beat faster (aerobic exercise) most days of the week. Activities may include walking, swimming, or biking.  Work with your health care provider or diet and nutrition specialist (dietitian) to adjust your eating plan to your individual calorie needs. Reading food labels   Check food labels for the amount of sodium per serving. Choose foods with less than 5 percent of the Daily Value of sodium. Generally, foods with less than 300 mg of sodium per serving fit into this eating plan.  To find whole grains, look for the word "whole" as the first word in the ingredient list. Shopping  Buy products labeled as "low-sodium" or "no salt added."  Buy fresh foods. Avoid canned foods and premade or frozen meals. Cooking  Avoid adding salt when cooking. Use salt-free seasonings or herbs instead of table salt or sea salt. Check with your health care provider or pharmacist before using salt substitutes.  Do not fry foods. Cook foods using healthy methods such as baking, boiling, grilling, and broiling instead.  Cook with  heart-healthy oils, such as olive, canola, soybean, or sunflower oil. Meal planning  Eat a balanced diet that includes: ? 5 or more servings of fruits and vegetables each day. At each meal, try to fill half of your plate with fruits and vegetables. ? Up to 6-8 servings of whole grains each day. ? Less than 6 oz of lean meat, poultry, or fish each day. A 3-oz serving of meat is about the same size as a deck of cards. One egg equals 1 oz. ? 2 servings of low-fat dairy each day. ? A serving of nuts, seeds, or beans 5 times each week. ? Heart-healthy fats. Healthy fats called Omega-3 fatty acids are found in foods such as flaxseeds and coldwater fish, like sardines, salmon, and mackerel.  Limit how much you eat of the following: ? Canned or prepackaged foods. ? Food that is high in trans fat, such as fried foods. ? Food that is high in saturated fat, such as fatty meat. ? Sweets, desserts, sugary drinks, and other foods with added sugar. ? Full-fat dairy products.  Do not salt foods before eating.  Try to eat at least 2 vegetarian meals each week.  Eat more home-cooked food and less restaurant, buffet, and fast food.  When eating at a restaurant, ask that your food be prepared with less salt or no salt, if possible. What foods are recommended? The items listed may not be a complete list. Talk with your dietitian about   what dietary choices are best for you. Grains Whole-grain or whole-wheat bread. Whole-grain or whole-wheat pasta. Brown rice. Oatmeal. Quinoa. Bulgur. Whole-grain and low-sodium cereals. Pita bread. Low-fat, low-sodium crackers. Whole-wheat flour tortillas. Vegetables Fresh or frozen vegetables (raw, steamed, roasted, or grilled). Low-sodium or reduced-sodium tomato and vegetable juice. Low-sodium or reduced-sodium tomato sauce and tomato paste. Low-sodium or reduced-sodium canned vegetables. Fruits All fresh, dried, or frozen fruit. Canned fruit in natural juice (without  added sugar). Meat and other protein foods Skinless chicken or turkey. Ground chicken or turkey. Pork with fat trimmed off. Fish and seafood. Egg whites. Dried beans, peas, or lentils. Unsalted nuts, nut butters, and seeds. Unsalted canned beans. Lean cuts of beef with fat trimmed off. Low-sodium, lean deli meat. Dairy Low-fat (1%) or fat-free (skim) milk. Fat-free, low-fat, or reduced-fat cheeses. Nonfat, low-sodium ricotta or cottage cheese. Low-fat or nonfat yogurt. Low-fat, low-sodium cheese. Fats and oils Soft margarine without trans fats. Vegetable oil. Low-fat, reduced-fat, or light mayonnaise and salad dressings (reduced-sodium). Canola, safflower, olive, soybean, and sunflower oils. Avocado. Seasoning and other foods Herbs. Spices. Seasoning mixes without salt. Unsalted popcorn and pretzels. Fat-free sweets. What foods are not recommended? The items listed may not be a complete list. Talk with your dietitian about what dietary choices are best for you. Grains Baked goods made with fat, such as croissants, muffins, or some breads. Dry pasta or rice meal packs. Vegetables Creamed or fried vegetables. Vegetables in a cheese sauce. Regular canned vegetables (not low-sodium or reduced-sodium). Regular canned tomato sauce and paste (not low-sodium or reduced-sodium). Regular tomato and vegetable juice (not low-sodium or reduced-sodium). Pickles. Olives. Fruits Canned fruit in a light or heavy syrup. Fried fruit. Fruit in cream or butter sauce. Meat and other protein foods Fatty cuts of meat. Ribs. Fried meat. Bacon. Sausage. Bologna and other processed lunch meats. Salami. Fatback. Hotdogs. Bratwurst. Salted nuts and seeds. Canned beans with added salt. Canned or smoked fish. Whole eggs or egg yolks. Chicken or turkey with skin. Dairy Whole or 2% milk, cream, and half-and-half. Whole or full-fat cream cheese. Whole-fat or sweetened yogurt. Full-fat cheese. Nondairy creamers. Whipped toppings.  Processed cheese and cheese spreads. Fats and oils Butter. Stick margarine. Lard. Shortening. Ghee. Bacon fat. Tropical oils, such as coconut, palm kernel, or palm oil. Seasoning and other foods Salted popcorn and pretzels. Onion salt, garlic salt, seasoned salt, table salt, and sea salt. Worcestershire sauce. Tartar sauce. Barbecue sauce. Teriyaki sauce. Soy sauce, including reduced-sodium. Steak sauce. Canned and packaged gravies. Fish sauce. Oyster sauce. Cocktail sauce. Horseradish that you find on the shelf. Ketchup. Mustard. Meat flavorings and tenderizers. Bouillon cubes. Hot sauce and Tabasco sauce. Premade or packaged marinades. Premade or packaged taco seasonings. Relishes. Regular salad dressings. Where to find more information:  National Heart, Lung, and Blood Institute: www.nhlbi.nih.gov  American Heart Association: www.heart.org Summary  The DASH eating plan is a healthy eating plan that has been shown to reduce high blood pressure (hypertension). It may also reduce your risk for type 2 diabetes, heart disease, and stroke.  With the DASH eating plan, you should limit salt (sodium) intake to 2,300 mg a day. If you have hypertension, you may need to reduce your sodium intake to 1,500 mg a day.  When on the DASH eating plan, aim to eat more fresh fruits and vegetables, whole grains, lean proteins, low-fat dairy, and heart-healthy fats.  Work with your health care provider or diet and nutrition specialist (dietitian) to adjust your eating plan to your   individual calorie needs. This information is not intended to replace advice given to you by your health care provider. Make sure you discuss any questions you have with your health care provider. Document Revised: 05/27/2017 Document Reviewed: 06/07/2016 Elsevier Patient Education  2020 Elsevier Inc.  

## 2020-04-16 NOTE — Assessment & Plan Note (Signed)
Chronic, ongoing.  Continue current medication regimen and adjust as needed.  Labs today.  Return in 6 months.

## 2020-04-16 NOTE — Progress Notes (Signed)
BP 130/70 (BP Location: Left Arm, Patient Position: Sitting)    Pulse 79    Temp 98.6 F (37 C) (Oral)    Ht 5\' 7"  (1.702 m)    Wt 173 lb (78.5 kg)    SpO2 95%    BMI 27.10 kg/m    Subjective:    Patient ID: Michael Zuniga, male    DOB: 07/04/1950, 69 y.o.   MRN: [id number]  HPI: Michael Zuniga is a 69 y.o. male presenting on 04/16/2020 for comprehensive medical examination. Current medical complaints include:none  He currently lives with: self Interim Problems from his last visit: no   HYPERTENSION / HYPERLIPIDEMIA Continues on Amlodipine and Losartan + Lipitor for HLD. Utilizes Amlodipine for Raynaud as well.  He continues to be followed by GI for GERD and had EGD on 07/11/19. Satisfied with current treatment? yes Duration of hypertension: chronic BP monitoring frequency: daily BP range: 120/70-80 at home when checks BP medication side effects: no Duration of hyperlipidemia: chronic Cholesterol medication side effects: no Cholesterol supplements: none Medication compliance: good compliance Aspirin: yes Recent stressors: no Recurrent headaches: no Visual changes: no Palpitations: no Dyspnea: no Chest pain: no Lower extremity edema: no Dizzy/lightheaded: no  Functional Status Survey: Is the patient deaf or have difficulty hearing?: No Does the patient have difficulty seeing, even when wearing glasses/contacts?: No Does the patient have difficulty concentrating, remembering, or making decisions?: No Does the patient have difficulty walking or climbing stairs?: No Does the patient have difficulty dressing or bathing?: No Does the patient have difficulty doing errands alone such as visiting a doctor's office or shopping?: No  FALL RISK: Fall Risk  04/16/2020 03/06/2019 10/21/2017 10/18/2016 02/16/2016  Falls in the past year? 0 0 No No No  Number falls in past yr: - 0 - - -  Injury with Fall? - 0 - - -  Follow up Falls evaluation completed - - - -    Depression  Screen Depression screen [facility] 2/9 04/16/2020 03/06/2019 10/21/2017 10/18/2016 02/16/2016  Decreased Interest 0 0 0 0 0  Down, Depressed, Hopeless 0 0 0 0 0  PHQ - 2 Score 0 0 0 0 0  Altered sleeping 1 1 1  - -  Tired, decreased energy 0 0 0 - -  Change in appetite 0 0 0 - -  Feeling bad or failure about yourself  0 0 0 - -  Trouble concentrating 0 0 0 - -  Moving slowly or fidgety/restless 0 0 0 - -  Suicidal thoughts 0 0 0 - -  PHQ-9 Score 1 1 1  - -  Difficult doing work/chores Not difficult at all Not difficult at all - - -    Advanced Directives <no information>  Past Medical History:  Past Medical History:  Diagnosis Date   Coronary artery disease    Dysphagia    High blood pressure     Surgical History:  Past Surgical History:  Procedure Laterality Date   CORONARY ANGIOPLASTY WITH STENT PLACEMENT     ESOPHAGOGASTRODUODENOSCOPY (EGD) WITH PROPOFOL N/A 07/11/2019   Procedure: ESOPHAGOGASTRODUODENOSCOPY (EGD) WITH PROPOFOL;  Surgeon: Lin Landsman, MD;  Location: [facility] ENDOSCOPY;  Service: Gastroenterology;  Laterality: N/A;   HERNIA REPAIR      Medications:  Current Outpatient Medications on File Prior to Visit  Medication Sig   aspirin EC 81 MG tablet Take 81 mg by mouth daily.   omeprazole (PRILOSEC) 40 MG capsule Take 1 capsule (40 mg total) by mouth 2 (  two) times daily before a meal.   sildenafil (REVATIO) 20 MG tablet TAKE 1-5 TABLETS AS NEEDED   Current Facility-Administered Medications on File Prior to Visit  Medication   0.9 %  sodium chloride infusion    Allergies:  No Known Allergies  Social History:  Social History   Socioeconomic History   Marital status: Married    Spouse name: Not on file   Number of children: 2   Years of education: Not on file   Highest education level: Not on file  Occupational History   Occupation: Glass blower/designer  Tobacco Use   Smoking status: Never Smoker   Smokeless tobacco: Never Used  Brewing technologist Use: Never used  Substance and Sexual Activity   Alcohol use: No   Drug use: No   Sexual activity: Yes  Other Topics Concern   Not on file  Social History Narrative   Not on file   Social Determinants of Health   Financial Resource Strain:    Difficulty of Paying Living Expenses: Not on file  Food Insecurity:    Worried About Charity fundraiser in the Last Year: Not on file   [employer] of Food in the Last Year: Not on file  Transportation Needs:    Lack of Transportation (Medical): Not on file   Lack of Transportation (Non-Medical): Not on file  Physical Activity:    Days of Exercise per Week: Not on file   Minutes of Exercise per Session: Not on file  Stress:    Feeling of Stress : Not on file  Social Connections:    Frequency of Communication with Friends and Family: Not on file   Frequency of Social Gatherings with Friends and Family: Not on file   Attends Religious Services: Not on file   Active Member of Clubs or Organizations: Not on file   Attends Archivist Meetings: Not on file   Marital Status: Not on file  Intimate Partner Violence:    Fear of Current or Ex-Partner: Not on file   Emotionally Abused: Not on file   Physically Abused: Not on file   Sexually Abused: Not on file   Social History   Tobacco Use  Smoking Status Never Smoker  Smokeless Tobacco Never Used   Social History   Substance and Sexual Activity  Alcohol Use No    Family History:  Family History  Problem Relation Age of Onset   Lung cancer Mother    Lung cancer Father    Thyroid disease Daughter    Arthritis Maternal Grandmother    Colon cancer Neg Hx     Past medical history, surgical history, medications, allergies, family history and social history reviewed with patient today and changes made to appropriate areas of the chart.   Review of Systems - negative All other ROS negative except what is listed above and in the HPI.       Objective:    BP 130/70 (BP Location: Left Arm, Patient Position: Sitting)    Pulse 79    Temp 98.6 F (37 C) (Oral)    Ht 5\' 7"  (1.702 m)    Wt 173 lb (78.5 kg)    SpO2 95%    BMI 27.10 kg/m   Wt Readings from Last 3 Encounters:  04/16/20 173 lb (78.5 kg)  07/11/19 175 lb (79.4 kg)  07/05/19 180 lb (81.6 kg)    Physical Exam Vitals and nursing note reviewed.  Constitutional:  General: He is awake. He is not in acute distress.    Appearance: He is well-developed. He is not ill-appearing.  HENT:     Head: Normocephalic and atraumatic.     Right Ear: Hearing, tympanic membrane, ear canal and external ear normal. No drainage.     Left Ear: Hearing, tympanic membrane, ear canal and external ear normal. No drainage.     Nose: Nose normal.     Mouth/Throat:     Mouth: Mucous membranes are moist.     Pharynx: Uvula midline.  Eyes:     General: Lids are normal.        Right eye: No discharge.        Left eye: No discharge.     Extraocular Movements: Extraocular movements intact.     Conjunctiva/sclera: Conjunctivae normal.     Pupils: Pupils are equal, round, and reactive to light.     Visual Fields: Right eye visual fields normal and left eye visual fields normal.  Neck:     Thyroid: No thyromegaly.     Vascular: No carotid bruit.  Cardiovascular:     Rate and Rhythm: Normal rate and regular rhythm.     Heart sounds: Normal heart sounds, S1 normal and S2 normal. No murmur heard.  No gallop.   Pulmonary:     Effort: Pulmonary effort is normal. No accessory muscle usage or respiratory distress.     Breath sounds: Normal breath sounds.  Abdominal:     General: Bowel sounds are normal.     Palpations: Abdomen is soft. There is no hepatomegaly or splenomegaly.     Tenderness: There is no abdominal tenderness.     Hernia: There is no hernia in the left inguinal area or right inguinal area.  Genitourinary:    Penis: Normal.      Testes: Normal.     Comments: Deferred prostate  exam per patient request. Musculoskeletal:        General: Normal range of motion.     Cervical back: Normal range of motion and neck supple.     Right lower leg: No edema.     Left lower leg: No edema.  Skin:    General: Skin is warm and dry.     Capillary Refill: Capillary refill takes less than 2 seconds.     Findings: No rash.  Neurological:     Mental Status: He is alert and oriented to person, place, and time.     Cranial Nerves: Cranial nerves are intact.     Gait: Gait is intact.     Deep Tendon Reflexes: Reflexes are normal and symmetric.     Reflex Scores:      Brachioradialis reflexes are 2+ on the right side and 2+ on the left side.      Patellar reflexes are 2+ on the right side and 2+ on the left side. Psychiatric:        Attention and Perception: Attention normal.        Mood and Affect: Mood normal.        Speech: Speech normal.        Behavior: Behavior normal. Behavior is cooperative.        Thought Content: Thought content normal.        Judgment: Judgment normal.    6CIT Screen 04/16/2020 03/06/2019  What Year? 0 points 0 points  What month? 0 points 0 points  What time? 0 points 0 points  Count back from 20 0  points 0 points  Months in reverse 0 points 0 points  Repeat phrase 0 points 0 points  Total Score 0 0    Results for orders placed or performed in visit on 07/06/30  Basic metabolic panel  Result Value Ref Range   Glucose 102 (H) 65 - 99 mg/dL   BUN 17 8 - 27 mg/dL   Creatinine, Ser 1.06 0.76 - 1.27 mg/dL   GFR calc non Af Amer 70 >59 mL/min/1.73   GFR calc Af Amer 81 >59 mL/min/1.73   BUN/Creatinine Ratio 16 10 - 24   Sodium 140 134 - 144 mmol/L   Potassium 4.2 3.5 - 5.2 mmol/L   Chloride 102 96 - 106 mmol/L   CO2 21 20 - 29 mmol/L   Calcium 10.3 (H) 8.6 - 10.2 mg/dL  Lipid Panel w/o Chol/HDL Ratio  Result Value Ref Range   Cholesterol, Total 152 100 - 199 mg/dL   Triglycerides 44 0 - 149 mg/dL   HDL 87 >39 mg/dL   VLDL Cholesterol Cal  10 5 - 40 mg/dL   LDL Chol Calc (NIH) 55 0 - 99 mg/dL      Assessment & Plan:   Problem List Items Addressed This Visit      Cardiovascular and Mediastinum   Hypertension    Chronic, ongoing. BP at goal in office initially elevated, but repeat at goal and home BP at goal.  Continue to monitor at home regularly and continue current medication regimen, adjust as needed.  Labs today.  DASH diet focus.  Return in 6 months for follow-up.      Relevant Medications   losartan (COZAAR) 50 MG tablet   atorvastatin (LIPITOR) 40 MG tablet   amLODipine (NORVASC) 10 MG tablet   Other Relevant Orders   CBC with Differential/Platelet   Comprehensive metabolic panel   TSH   CAD (coronary artery disease)    Continue daily ASA and Lipitor, adjust Lipitor dose as needed to meet goals and prevent stroke.      Relevant Medications   losartan (COZAAR) 50 MG tablet   atorvastatin (LIPITOR) 40 MG tablet   amLODipine (NORVASC) 10 MG tablet     Digestive   Gastroesophageal reflux disease with esophagitis without hemorrhage    Chronic, ongoing.  Continue PPI as ordered by GI and collaboration with GI team.         Other   Hyperlipidemia    Chronic, ongoing.  Continue current medication regimen and adjust as needed.  Labs today.  Return in 6 months.      Relevant Medications   losartan (COZAAR) 50 MG tablet   atorvastatin (LIPITOR) 40 MG tablet   amLODipine (NORVASC) 10 MG tablet   Other Relevant Orders   Lipid Panel w/o Chol/HDL Ratio   ED (erectile dysfunction)    With HTN.  Current medication regimen effective, refills up to date.  Patient educated on appropriate amount of medication to take.      Raynaud phenomenon    No current issues, has given up diving which helps.  Continue to monitor and return for worsening symptoms.  Continue Amlodipine.       Other Visit Diagnoses    Routine general medical examination at a health care facility    -  Primary   Annual routine labs today to  include CBC, CMP, TSH, lipid   Relevant Orders   CBC with Differential/Platelet   TSH   Need for immunization against influenza  Flu vaccine today.   Relevant Orders   Flu Vaccine QUAD High Dose(Fluad) (Completed)   Prostate cancer screening       PSA on labs today after discussion with patient, wishes to continue screening.   Relevant Orders   PSA      Discussed aspirin prophylaxis for myocardial infarction prevention and decision was made to continue ASA  LABORATORY TESTING:  Health maintenance labs ordered today as discussed above.   The natural history of prostate cancer and ongoing controversy regarding screening and potential treatment outcomes of prostate cancer has been discussed with the patient. The meaning of a false positive PSA and a false negative PSA has been discussed. He indicates understanding of the limitations of this screening test and wishes to proceed with screening PSA testing.  He is aware of guideline recommendations for PSA screening from 50 to 70, he would like to continue screening at this time.   IMMUNIZATIONS:   - Tdap: Tetanus vaccination status reviewed: Up To Date - Influenza: Up to date - Pneumovax: Up to date - Prevnar: Up to date - Zostavax vaccine: Up to date  SCREENING: - Colonoscopy: Up to date  Discussed with patient purpose of the colonoscopy is to detect colon cancer at curable precancerous or early stages   - AAA Screening: Not applicable  -Hearing Test: Not applicable  -Spirometry: Not applicable   PATIENT COUNSELING:    Sexuality: Discussed sexually transmitted diseases, partner selection, use of condoms, avoidance of unintended pregnancy  and contraceptive alternatives.   Advised to avoid cigarette smoking.  I discussed with the patient that most people either abstain from alcohol or drink within safe limits (<=14/week and <=4 drinks/occasion for males, <=7/weeks and <= 3 drinks/occasion for females) and that the risk for  alcohol disorders and other health effects rises proportionally with the number of drinks per week and how often a drinker exceeds daily limits.  Discussed cessation/primary prevention of drug use and availability of treatment for abuse.   Diet: Encouraged to adjust caloric intake to maintain  or achieve ideal body weight, to reduce intake of dietary saturated fat and total fat, to limit sodium intake by avoiding high sodium foods and not adding table salt, and to maintain adequate dietary potassium and calcium preferably from fresh fruits, vegetables, and low-fat dairy products.    stressed the importance of regular exercise  Injury prevention: Discussed safety belts, safety helmets, smoke detector, smoking near bedding or upholstery.   Dental health: Discussed importance of regular tooth brushing, flossing, and dental visits.   Follow up plan: NEXT PREVENTATIVE PHYSICAL DUE IN 1 YEAR. Return in about 6 months (around 10/15/2020) for HTN/HLD.

## 2020-04-16 NOTE — Assessment & Plan Note (Addendum)
With HTN.  Current medication regimen effective, refills up to date.  Patient educated on appropriate amount of medication to take.

## 2020-04-17 ENCOUNTER — Other Ambulatory Visit: Payer: Self-pay | Admitting: Nurse Practitioner

## 2020-04-17 DIAGNOSIS — R7401 Elevation of levels of liver transaminase levels: Secondary | ICD-10-CM

## 2020-04-17 LAB — CBC WITH DIFFERENTIAL/PLATELET
Basophils Absolute: 0 10*3/uL (ref 0.0–0.2)
Basos: 1 %
EOS (ABSOLUTE): 0 10*3/uL (ref 0.0–0.4)
Eos: 0 %
Hematocrit: 41.8 % (ref 37.5–51.0)
Hemoglobin: 14.3 g/dL (ref 13.0–17.7)
Immature Grans (Abs): 0 10*3/uL (ref 0.0–0.1)
Immature Granulocytes: 0 %
Lymphocytes Absolute: 0.9 10*3/uL (ref 0.7–3.1)
Lymphs: 16 %
MCH: 33.2 pg — ABNORMAL HIGH (ref 26.6–33.0)
MCHC: 34.2 g/dL (ref 31.5–35.7)
MCV: 97 fL (ref 79–97)
Monocytes Absolute: 0.6 10*3/uL (ref 0.1–0.9)
Monocytes: 11 %
Neutrophils Absolute: 3.9 10*3/uL (ref 1.4–7.0)
Neutrophils: 72 %
Platelets: 234 10*3/uL (ref 150–450)
RBC: 4.31 x10E6/uL (ref 4.14–5.80)
RDW: 13.7 % (ref 11.6–15.4)
WBC: 5.4 10*3/uL (ref 3.4–10.8)

## 2020-04-17 LAB — LIPID PANEL W/O CHOL/HDL RATIO
Cholesterol, Total: 184 mg/dL (ref 100–199)
HDL: 102 mg/dL (ref >39)
LDL Chol Calc (NIH): 72 mg/dL (ref 0–99)
Triglycerides: 49 mg/dL (ref 0–149)
VLDL Cholesterol Cal: 10 mg/dL (ref 5–40)

## 2020-04-17 LAB — COMPREHENSIVE METABOLIC PANEL
ALT: 126 IU/L — ABNORMAL HIGH (ref 0–44)
AST: 152 IU/L — ABNORMAL HIGH (ref 0–40)
Albumin/Globulin Ratio: 2.1 (ref 1.2–2.2)
Albumin: 5.2 g/dL — ABNORMAL HIGH (ref 3.7–4.7)
Alkaline Phosphatase: 90 IU/L (ref 44–121)
BUN/Creatinine Ratio: 12 (ref 10–24)
BUN: 13 mg/dL (ref 8–27)
Bilirubin Total: 0.7 mg/dL (ref 0.0–1.2)
CO2: 22 mmol/L (ref 20–29)
Calcium: 10.3 mg/dL — ABNORMAL HIGH (ref 8.6–10.2)
Chloride: 99 mmol/L (ref 96–106)
Creatinine, Ser: 1.08 mg/dL (ref 0.76–1.27)
GFR calc Af Amer: 79 mL/min/{1.73_m2} (ref >59)
GFR calc non Af Amer: 69 mL/min/{1.73_m2} (ref >59)
Globulin, Total: 2.5 g/dL (ref 1.5–4.5)
Glucose: 98 mg/dL (ref 65–99)
Potassium: 4.3 mmol/L (ref 3.5–5.2)
Sodium: 137 mmol/L (ref 134–144)
Total Protein: 7.7 g/dL (ref 6.0–8.5)

## 2020-04-17 LAB — PSA: Prostate Specific Ag, Serum: 1.1 ng/mL (ref 0.0–4.0)

## 2020-04-17 LAB — TSH: TSH: 1.01 u[IU]/mL (ref 0.450–4.500)

## 2020-04-17 NOTE — Progress Notes (Signed)
Good afternoon, please let Mr. Karapetyan know his labs have returned.   - Calcium continues to be mildly elevated, will recheck this next labs - Liver function tests were quite elevated from your baseline, have you been drinking alcohol or taking lots of Tylenol?  If so I recommend cutting back significantly on alcohol intake or Tylenol and we need to recheck these in 6 weeks via an outpatient lab visit, please schedule this.  Your normal is 32 and 40, but this check it was 152 and 126.   - CBC, thyroid, kidney function, cholesterol, and prostate labs are all normal.  Continue current medications.  Any questions? Keep being awesome!!  Thank you for allowing me to participate in your care. Kindest regards, Michael Zuniga

## 2020-05-27 ENCOUNTER — Other Ambulatory Visit: Payer: PRIVATE HEALTH INSURANCE

## 2020-05-27 ENCOUNTER — Other Ambulatory Visit: Payer: Self-pay

## 2020-05-27 DIAGNOSIS — R7401 Elevation of levels of liver transaminase levels: Secondary | ICD-10-CM

## 2020-05-28 LAB — VITAMIN D 25 HYDROXY (VIT D DEFICIENCY, FRACTURES): Vit D, 25-Hydroxy: 44.5 ng/mL (ref 30.0–100.0)

## 2020-05-28 LAB — HEPATIC FUNCTION PANEL
ALT: 34 IU/L (ref 0–44)
AST: 30 IU/L (ref 0–40)
Albumin: 4.5 g/dL (ref 3.7–4.7)
Alkaline Phosphatase: 78 IU/L (ref 44–121)
Bilirubin Total: 0.4 mg/dL (ref 0.0–1.2)
Bilirubin, Direct: 0.14 mg/dL (ref 0.00–0.40)
Total Protein: 6.9 g/dL (ref 6.0–8.5)

## 2020-05-28 LAB — PTH, INTACT AND CALCIUM
Calcium: 9.6 mg/dL (ref 8.6–10.2)
PTH: 35 pg/mL (ref 15–65)

## 2020-05-28 NOTE — Progress Notes (Signed)
Good morning, please let Mr. Reister know his labs have returned and Vitamin D level is normal.  Calcium level has trended back down to normal and parathyroid hormone testing is normal.  Liver function tests have returned to normal range.  GREAT job!!  Continue to cut back on alcohol and Tylenol use.  Much improved.  We will recheck at next visit.  Have a wonderful day!! Keep being awesome!!  Thank you for allowing me to participate in your care. Kindest regards, Burl Tauzin

## 2020-08-12 ENCOUNTER — Other Ambulatory Visit: Payer: Self-pay | Admitting: Gastroenterology

## 2020-08-12 DIAGNOSIS — R1319 Other dysphagia: Secondary | ICD-10-CM

## 2020-08-12 DIAGNOSIS — K222 Esophageal obstruction: Secondary | ICD-10-CM

## 2020-08-21 ENCOUNTER — Other Ambulatory Visit: Payer: Self-pay | Admitting: Gastroenterology

## 2020-08-21 DIAGNOSIS — K222 Esophageal obstruction: Secondary | ICD-10-CM

## 2020-08-21 DIAGNOSIS — R1319 Other dysphagia: Secondary | ICD-10-CM

## 2020-09-17 ENCOUNTER — Other Ambulatory Visit: Payer: Self-pay | Admitting: Nurse Practitioner

## 2020-09-17 DIAGNOSIS — K222 Esophageal obstruction: Secondary | ICD-10-CM

## 2020-09-17 DIAGNOSIS — R1319 Other dysphagia: Secondary | ICD-10-CM

## 2020-09-17 NOTE — Telephone Encounter (Signed)
Requested medications are due for refill today.  yes  Requested medications are on the active medications list. yes  Last refill. 04/16/2020  Future visit scheduled.   yes  Notes to clinic.  Rx written by Dr. Marius Ditch

## 2020-09-18 NOTE — Telephone Encounter (Signed)
Written by GI

## 2020-09-18 NOTE — Telephone Encounter (Signed)
I would recommend he reach out to GI to see if they wish him to continue at this dosage or reduce.  Thank you.

## 2020-09-18 NOTE — Telephone Encounter (Signed)
Called and left a message letting patient know that he needs to reach out to GI

## 2020-10-16 ENCOUNTER — Telehealth: Payer: Self-pay

## 2020-10-16 NOTE — Telephone Encounter (Signed)
Lvm to change 4/22 appt to a virtual

## 2020-10-17 ENCOUNTER — Ambulatory Visit: Payer: PRIVATE HEALTH INSURANCE | Admitting: Nurse Practitioner

## 2020-11-03 ENCOUNTER — Encounter: Payer: Self-pay | Admitting: Nurse Practitioner

## 2020-11-03 ENCOUNTER — Other Ambulatory Visit: Payer: Self-pay

## 2020-11-03 ENCOUNTER — Ambulatory Visit (INDEPENDENT_AMBULATORY_CARE_PROVIDER_SITE_OTHER): Payer: Self-pay | Admitting: Nurse Practitioner

## 2020-11-03 VITALS — BP 129/83 | HR 91 | Temp 97.7°F | Wt 167.2 lb

## 2020-11-03 DIAGNOSIS — I2510 Atherosclerotic heart disease of native coronary artery without angina pectoris: Secondary | ICD-10-CM

## 2020-11-03 DIAGNOSIS — R251 Tremor, unspecified: Secondary | ICD-10-CM

## 2020-11-03 DIAGNOSIS — Z5989 Other problems related to housing and economic circumstances: Secondary | ICD-10-CM

## 2020-11-03 DIAGNOSIS — G250 Essential tremor: Secondary | ICD-10-CM | POA: Insufficient documentation

## 2020-11-03 DIAGNOSIS — I10 Essential (primary) hypertension: Secondary | ICD-10-CM

## 2020-11-03 DIAGNOSIS — E7800 Pure hypercholesterolemia, unspecified: Secondary | ICD-10-CM

## 2020-11-03 MED ORDER — PROPRANOLOL HCL 20 MG PO TABS
20.0000 mg | ORAL_TABLET | Freq: Every day | ORAL | 4 refills | Status: DC
  Filled 2020-11-03: qty 30, 30d supply, fill #0

## 2020-11-03 MED ORDER — ATORVASTATIN CALCIUM 40 MG PO TABS
40.0000 mg | ORAL_TABLET | Freq: Every day | ORAL | 4 refills | Status: DC
  Filled 2020-11-03 (×2): qty 30, 30d supply, fill #0
  Filled 2020-12-18: qty 90, 90d supply, fill #1

## 2020-11-03 MED ORDER — LOSARTAN POTASSIUM 50 MG PO TABS
50.0000 mg | ORAL_TABLET | Freq: Every day | ORAL | 4 refills | Status: DC
  Filled 2020-11-03: qty 30, 30d supply, fill #0
  Filled 2020-12-18: qty 90, 90d supply, fill #1

## 2020-11-03 MED ORDER — AMLODIPINE BESYLATE 10 MG PO TABS
10.0000 mg | ORAL_TABLET | Freq: Every day | ORAL | 4 refills | Status: DC
  Filled 2020-11-03 (×2): qty 30, 30d supply, fill #0
  Filled 2020-12-11: qty 30, 30d supply, fill #1
  Filled 2021-01-12: qty 90, 90d supply, fill #2

## 2020-11-03 NOTE — Progress Notes (Signed)
BP 129/83   Pulse 91   Temp 97.7 F (36.5 C) (Oral)   Wt 167 lb 3.2 oz (75.8 kg)   SpO2 95%   BMI 26.19 kg/m    Subjective:    Patient ID: Michael Zuniga, male    DOB: Sep 23, 1950, 70 y.o.   MRN: [id number]  HPI: Michael Zuniga is a 70 y.o. male  Chief Complaint  Patient presents with  . Hyperlipidemia  . Hypertension   HYPERTENSION / HYPERLIPIDEMIA Continues on Amlodipine, Losartan, ASA, and Atorvastatin.  Currently is not working, has to quit due to tremor.  Has had tremor in both upper extremities for long time, but feels this is getting a little worse on left side.  This is an active tremor -- difficult to write at times.  Denies alcohol use -- no history of heavy alcohol use per patient.  No history of PD in family.    Satisfied with current treatment? yes Duration of hypertension: chronic BP monitoring frequency: rarely BP range: 130/70-80's at home BP medication side effects: no Duration of hyperlipidemia: chronic Cholesterol medication side effects: no Cholesterol supplements: none Medication compliance: good compliance Aspirin: yes Recent stressors: no Recurrent headaches: no Visual changes: no Palpitations: no Dyspnea: no Chest pain: no Lower extremity edema: no Dizzy/lightheaded: no  Relevant past medical, surgical, family and social history reviewed and updated as indicated. Interim medical history since our last visit reviewed. Allergies and medications reviewed and updated.  Review of Systems  Constitutional: Negative for activity change, diaphoresis, fatigue and fever.  Respiratory: Negative for cough, chest tightness, shortness of breath and wheezing.   Cardiovascular: Negative for chest pain, palpitations and leg swelling.  Gastrointestinal: Negative.   Neurological: Positive for tremors. Negative for dizziness, seizures, facial asymmetry, speech difficulty, weakness and headaches.  Psychiatric/Behavioral: Negative.     Per HPI unless specifically  indicated above     Objective:    BP 129/83   Pulse 91   Temp 97.7 F (36.5 C) (Oral)   Wt 167 lb 3.2 oz (75.8 kg)   SpO2 95%   BMI 26.19 kg/m   Wt Readings from Last 3 Encounters:  11/03/20 167 lb 3.2 oz (75.8 kg)  04/16/20 173 lb (78.5 kg)  07/11/19 175 lb (79.4 kg)    Physical Exam Vitals and nursing note reviewed.  Constitutional:      General: He is awake. He is not in acute distress.    Appearance: He is well-developed and well-groomed. He is not ill-appearing.  HENT:     Head: Normocephalic and atraumatic.     Right Ear: Hearing normal. No drainage.     Left Ear: Hearing normal. No drainage.  Eyes:     General: Lids are normal.        Right eye: No discharge.        Left eye: No discharge.     Conjunctiva/sclera: Conjunctivae normal.     Pupils: Pupils are equal, round, and reactive to light.  Neck:     Vascular: No carotid bruit.  Cardiovascular:     Rate and Rhythm: Normal rate and regular rhythm.     Heart sounds: Normal heart sounds, S1 normal and S2 normal. No murmur heard. No gallop.   Pulmonary:     Effort: Pulmonary effort is normal. No accessory muscle usage or respiratory distress.     Breath sounds: Normal breath sounds.  Abdominal:     General: Bowel sounds are normal.     Palpations: Abdomen  is soft.  Musculoskeletal:        General: Normal range of motion.     Cervical back: Normal range of motion and neck supple.     Right lower leg: No edema.     Left lower leg: No edema.  Skin:    General: Skin is warm and dry.  Neurological:     Mental Status: He is alert and oriented to person, place, and time.     Cranial Nerves: Cranial nerves are intact.     Motor: Tremor (bilateral upper extremities) present. No weakness or atrophy.     Coordination: Romberg sign negative. Finger-Nose-Finger Test and Heel to Maurice Test normal.     Gait: Gait is intact.     Deep Tendon Reflexes: Reflexes are normal and symmetric.     Reflex Scores:       Brachioradialis reflexes are 2+ on the right side and 2+ on the left side.      Patellar reflexes are 2+ on the right side.    Comments: Normal gait, no shuffle or shortened gait.  Tremor bilateral upper extremity -- L>R more with action, less with rest.  No rigidity on exam.  No flat affect.  Psychiatric:        Attention and Perception: Attention normal.        Mood and Affect: Mood normal.        Speech: Speech normal.        Behavior: Behavior normal. Behavior is cooperative.    Results for orders placed or performed in visit on 05/27/20  VITAMIN D 25 Hydroxy (Vit-D Deficiency, Fractures)  Result Value Ref Range   Vit D, 25-Hydroxy 44.5 30.0 - 100.0 ng/mL  PTH, Intact and Calcium  Result Value Ref Range   Calcium 9.6 8.6 - 10.2 mg/dL   PTH 35 15 - 65 pg/mL   PTH Interp Comment   Hepatic function panel  Result Value Ref Range   Total Protein 6.9 6.0 - 8.5 g/dL   Albumin 4.5 3.7 - 4.7 g/dL   Bilirubin Total 0.4 0.0 - 1.2 mg/dL   Bilirubin, Direct 0.14 0.00 - 0.40 mg/dL   Alkaline Phosphatase 78 44 - 121 IU/L   AST 30 0 - 40 IU/L   ALT 34 0 - 44 IU/L      Assessment & Plan:   Problem List Items Addressed This Visit      Cardiovascular and Mediastinum   Hypertension - Primary    Chronic, ongoing. BP at goal in office and on home readings.  Recommend he monitor BP at least a few mornings a week at home and document.  DASH diet at home.  Continue current medication regimen and adjust as needed.  Labs next visit, currently without insurance -- will place CCM emergent referral for this and send medications to [facility] medication management, provided him pamphlet on this.  Return in 6 months.       Relevant Medications   amLODipine (NORVASC) 10 MG tablet   atorvastatin (LIPITOR) 40 MG tablet   losartan (COZAAR) 50 MG tablet   propranolol (INDERAL) 20 MG tablet   Other Relevant Orders   AMB Referral to [facility]   CAD (coronary artery disease)    Continue daily  ASA and Lipitor, adjust Lipitor dose as needed to meet goals and prevent stroke.      Relevant Medications   amLODipine (NORVASC) 10 MG tablet   atorvastatin (LIPITOR) 40 MG tablet   losartan (COZAAR) 50  MG tablet   propranolol (INDERAL) 20 MG tablet     Other   Hyperlipidemia    Chronic, ongoing.  Continue current medication regimen and adjust as needed.  currently without insurance -- will place CCM emergent referral for this and send medications to [facility] medication management, provided him pamphlet on this.  Return in 6 months.      Relevant Medications   amLODipine (NORVASC) 10 MG tablet   atorvastatin (LIPITOR) 40 MG tablet   losartan (COZAAR) 50 MG tablet   propranolol (INDERAL) 20 MG tablet   Other Relevant Orders   AMB Referral to [facility]   Does not have health insurance    Currently without insurance -- will place CCM emergent referral for this and send medications to [facility] medication management, provided him pamphlet on this.  He denies need for help with bills at this time, but will monitor closely for this need.      Relevant Orders   AMB Referral to [facility] Coordinaton   Tremor of both hands    Ongoing for several years -- ?PD vs essential, no rigidity, gait disturbance, or flat affect.  At this time will trial Propranolol starting at lower dose due to age, 57 MG, and increase as needed.  Currently without insurance -- will place CCM emergent referral for this and send medications to [facility] medication management, provided him pamphlet on this.  Obtain labs next visit if able to afford.  Return in 4 weeks.          Follow up plan: Return in about 4 weeks (around 12/01/2020) for Tremor.

## 2020-11-03 NOTE — Assessment & Plan Note (Signed)
Continue daily ASA and Lipitor, adjust Lipitor dose as needed to meet goals and prevent stroke. °

## 2020-11-03 NOTE — Assessment & Plan Note (Signed)
Chronic, ongoing.  Continue current medication regimen and adjust as needed.  currently without insurance -- will place CCM emergent referral for this and send medications to [facility] medication management, provided him pamphlet on this.  Return in 6 months.

## 2020-11-03 NOTE — Assessment & Plan Note (Signed)
Chronic, ongoing. BP at goal in office and on home readings.  Recommend he monitor BP at least a few mornings a week at home and document.  DASH diet at home.  Continue current medication regimen and adjust as needed.  Labs next visit, currently without insurance -- will place CCM emergent referral for this and send medications to [facility] medication management, provided him pamphlet on this.  Return in 6 months.

## 2020-11-03 NOTE — Patient Instructions (Signed)

## 2020-11-03 NOTE — Assessment & Plan Note (Signed)
Ongoing for several years -- ?PD vs essential, no rigidity, gait disturbance, or flat affect.  At this time will trial Propranolol starting at lower dose due to age, 14 MG, and increase as needed.  Currently without insurance -- will place CCM emergent referral for this and send medications to [facility] medication management, provided him pamphlet on this.  Obtain labs next visit if able to afford.  Return in 4 weeks.

## 2020-11-03 NOTE — Assessment & Plan Note (Signed)
Currently without insurance -- will place CCM emergent referral for this and send medications to [facility] medication management, provided him pamphlet on this.  He denies need for help with bills at this time, but will monitor closely for this need.

## 2020-11-04 ENCOUNTER — Telehealth: Payer: Self-pay

## 2020-11-04 NOTE — Chronic Care Management (AMB) (Signed)
  Care Management   Outreach Note  11/04/2020 Name: Michael Zuniga MRN: [id number] DOB: 1951/01/13  Referred by: Venita Lick, NP Reason for referral : Care Coordination (Outreach to schedule referral for Pharm D and RN CM )   An unsuccessful telephone outreach was attempted today. The patient was referred to the case management team for assistance with care management and care coordination.   Follow Up Plan: A HIPAA compliant phone message was left for the patient providing contact information and requesting a return call.  The care management team will reach out to the patient again over the next 5 days.  If patient returns call to provider office, please advise to call [place] at [place], [place], [place], [place] [address] Direct Dial: [phone] [email or web address] Website: [email or web address]

## 2020-11-13 NOTE — Chronic Care Management (AMB) (Signed)
  Care Management   Note  11/13/2020 Name: Michael Zuniga MRN: [id number] DOB: Jan 11, 1951  Michael Zuniga is a 70 y.o. year old male who is a primary care patient of Cannady, Barbaraann Faster, NP. I reached out to Michael Zuniga by phone today in response to a referral sent by Michael Zuniga.    Michael Zuniga was given information about care management services today including:  1. Care management services include personalized support from designated clinical staff supervised by his physician, including individualized Zuniga of care and coordination with other care providers 2. 24/7 contact phone numbers for assistance for urgent and routine care needs. 3. The patient may stop care management services at any time by phone call to the office staff.  Patient agreed to services and verbal consent obtained.   Follow up Zuniga: Telephone appointment with care management team member scheduled for:  Pharm D 11/21/2020 RN CM 12/26/2020  Noreene Larsson, [place], [place], [place] [address] Direct Dial: [phone] [email or web address] Website: [email or web address]

## 2020-11-19 ENCOUNTER — Telehealth: Payer: Self-pay | Admitting: Pharmacist

## 2020-11-19 NOTE — Chronic Care Management (AMB) (Signed)
    Chronic Care Management Pharmacy Assistant   Name: Michael Zuniga  MRN: [id number] DOB: 1951-01-27  Michael Zuniga is an 70 y.o. year old male who presents for his initial CCM visit with the clinical pharmacist.  Reason for Encounter: Initial CCM visit    Recent office visits:  11/03/2020 Marnee Guarneri, NP (PCP) General follow up. Referral to [facility]ordin[facility]. Started  Propranolol 20 mg. Follow up in 4 weeks.  Recent consult visits:  None noted  Hospital visits:  None in previous 6 months  Medications: Outpatient Encounter Medications as of 11/19/2020  Medication Sig  . amLODipine (NORVASC) 10 MG tablet Take 1 tablet (10 mg total) by mouth daily.  Marland Kitchen aspirin EC 81 MG tablet Take 81 mg by mouth daily.  Marland Kitchen atorvastatin (LIPITOR) 40 MG tablet Take 1 tablet (40 mg total) by mouth daily.  Marland Kitchen losartan (COZAAR) 50 MG tablet Take 1 tablet (50 mg total) by mouth daily.  Marland Kitchen omeprazole (PRILOSEC) 40 MG capsule Take 1 capsule (40 mg total) by mouth 2 (two) times daily before a meal.  . propranolol (INDERAL) 20 MG tablet Take 1 tablet (20 mg total) by mouth daily.  . sildenafil (REVATIO) 20 MG tablet TAKE 1-5 TABLETS AS NEEDED   Facility-Administered Encounter Medications as of 11/19/2020  Medication  . 0.9 %  sodium chloride infusion   Amlodipine 10 mg Last filled:11/03/2020 30 DS Aspirin EC 81 mg Last filled:None noted Atorvastatin 40 mg Last filled:11/03/2020 30 DS Losartan 50 mg Last filled:11/03/2020 30 DS Omeprazole 40 mg Last filled:01/01/2020 90 DS Propanolol 20 mg Last filed:11/03/2020 30 DS Sildenafil 20 mg Last filled:12/28/2019 90 DS  Have you seen any other providers since your last visit?    Any changes in your medications or health?    Any side effects from any medications?   Do you have an symptoms or problems not managed by your medications?   Any concerns about your health right now?    Has your provider asked that you check blood pressure, blood  sugar, or follow special diet at home?   Do you get any type of exercise on a regular basis?   Can you think of a goal you would like to reach for your health?    Do you have any problems getting your medications?    Is there anything that you would like to discuss during the appointment?    Please bring medications and supplements to appointment   Patient completed visit CPP.  Star Rating Drugs: Atorvastatin 40 mg Last filled:11/03/2020 30 DS Losartan 50 mg Last filled:11/03/2020 30 DS  [facility] Clinical Pharmacist Assistant [phone]

## 2020-11-21 ENCOUNTER — Ambulatory Visit: Payer: Medicaid Other | Admitting: Pharmacist

## 2020-11-21 DIAGNOSIS — I10 Essential (primary) hypertension: Secondary | ICD-10-CM

## 2020-11-21 DIAGNOSIS — E782 Mixed hyperlipidemia: Secondary | ICD-10-CM

## 2020-11-21 NOTE — Progress Notes (Signed)
Chronic Care Management Pharmacy Note  11/21/2020 Name:  Michael Zuniga MRN:  [id number] DOB:  17-Jan-1951  Subjective: Michael Zuniga is an 70 y.o. year old male who is a primary patient of Cannady, Barbaraann Faster, NP.  The CCM team was consulted for assistance with disease management and care coordination needs.    Engaged with patient by telephone for initial visit in response to provider referral for pharmacy case management and/or care coordination services.   Consent to Services:  The patient was given the following information about Chronic Care Management services today, agreed to services, and gave verbal consent: 1. CCM service includes personalized support from designated clinical staff supervised by the primary care provider, including individualized plan of care and coordination with other care providers 2. 24/7 contact phone numbers for assistance for urgent and routine care needs. 3. Service will only be billed when office clinical staff spend 20 minutes or more in a month to coordinate care. 4. Only one practitioner may furnish and bill the service in a calendar month. 5.The patient may stop CCM services at any time (effective at the end of the month) by phone call to the office staff. 6. The patient will be responsible for cost sharing (co-pay) of up to 20% of the service fee (after annual deductible is met). Patient agreed to services and consent obtained.  Patient Care Team: Venita Lick, NP as PCP - General (Nurse Practitioner) Vladimir Faster, [facility] (Pharmacist) Vanita Ingles, RN as Registered Nurse (General Practice)  Recent office visits: 11/03/20- Cannady(PCP)- propranol dose change, sent to [facility] visits: 07/10/20- EGD Dr. Marius Ditch   Objective:  Lab Results  Component Value Date   CREATININE 1.08 04/16/2020   BUN 13 04/16/2020   GFRNONAA 69 04/16/2020   GFRAA 79 04/16/2020   NA 137 04/16/2020   K 4.3 04/16/2020   CALCIUM 9.6 05/27/2020   CO2 22  04/16/2020   GLUCOSE 98 04/16/2020    No results found for: HGBA1C, FRUCTOSAMINE, GFR, MICROALBUR  Last diabetic Eye exam: No results found for: HMDIABEYEEXA  Last diabetic Foot exam: No results found for: HMDIABFOOTEX   Lab Results  Component Value Date   CHOL 184 04/16/2020   HDL 102 04/16/2020   LDLCALC 72 04/16/2020   TRIG 49 04/16/2020    Hepatic Function Latest Ref Rng & Units 05/27/2020 04/16/2020 03/06/2019  Total Protein 6.0 - 8.5 g/dL 6.9 7.7 7.4  Albumin 3.7 - 4.7 g/dL 4.5 5.2(H) 5.1(H)  AST 0 - 40 IU/L 30 152(H) 32  ALT 0 - 44 IU/L 34 126(H) 40  Alk Phosphatase 44 - 121 IU/L 78 90 90  Total Bilirubin 0.0 - 1.2 mg/dL 0.4 0.7 0.6  Bilirubin, Direct 0.00 - 0.40 mg/dL 0.14 - -    Lab Results  Component Value Date/Time   TSH 1.010 04/16/2020 02:43 PM   TSH 1.910 03/06/2019 03:52 PM    CBC Latest Ref Rng & Units 04/16/2020 03/06/2019 10/21/2017  WBC 3.4 - 10.8 x10E3/uL 5.4 7.7 4.2  Hemoglobin 13.0 - 17.7 g/dL 14.3 14.2 13.3  Hematocrit 37.5 - 51.0 % 41.8 41.0 38.8  Platelets 150 - 450 x10E3/uL 234 289 284    Lab Results  Component Value Date/Time   VD25OH 44.5 05/27/2020 02:53 PM    Clinical ASCVD: Yes  The ASCVD Risk score Mikey Bussing DC Jr., et al., 2013) failed to calculate for the following reasons:   The valid HDL cholesterol range is 20 to 100 mg/dL  Depression screen [facility] 2/9 04/16/2020 03/06/2019 10/21/2017  Decreased Interest 0 0 0  Down, Depressed, Hopeless 0 0 0  PHQ - 2 Score 0 0 0  Altered sleeping _0 Tired, decreased energy 0 0 0  Change in appetite 0 0 0  Feeling bad or failure about yourself  0 0 0  Trouble concentrating 0 0 0  Moving slowly or fidgety/restless 0 0 0  Suicidal thoughts 0 0 0  PHQ-9 Score _1 Difficult doing work/chores Not difficult at all Not difficult at all -       Social History   Tobacco Use  Smoking Status Never Smoker  Smokeless Tobacco Never Used   BP Readings from Last 3 Encounters:  11/03/20 129/83   04/16/20 130/70  09/03/19 131/73   Pulse Readings from Last 3 Encounters:  11/03/20 91  04/16/20 79  09/03/19 69   Wt Readings from Last 3 Encounters:  11/03/20 167 lb 3.2 oz (75.8 kg)  04/16/20 173 lb (78.5 kg)  07/11/19 175 lb (79.4 kg)   BMI Readings from Last 3 Encounters:  11/03/20 26.19 kg/m  04/16/20 27.10 kg/m  07/11/19 25.47 kg/m    Assessment/Interventions: Review of patient past medical history, allergies, medications, health status, including review of consultants reports, laboratory and other test data, was performed as part of comprehensive evaluation and provision of chronic care management services.   SDOH:  (Social Determinants of Health) assessments and interventions performed: Yes  SDOH Screenings   Alcohol Screen: Not on file  Depression (PHQ2-9): Low Risk   . PHQ-2 Score: 1  Financial Resource Strain: Not on file  Food Insecurity: Not on file  Housing: Not on file  Physical Activity: Not on file  Social Connections: Not on file  Stress: Not on file  Tobacco Use: Low Risk   . Smoking Tobacco Use: Never Smoker  . Smokeless Tobacco Use: Never Used  Transportation Needs: Not on file     Immunization History  Administered Date(s) Administered  . Fluad Quad(high Dose 65+) 03/06/2019, 04/16/2020  . Influenza, High Dose Seasonal PF 03/17/2016, 04/21/2018  . Influenza-Unspecified 04/19/2017  . Pneumococcal Conjugate-13 02/16/2016  . Pneumococcal Polysaccharide-23 06/03/2017  . Td 03/06/2019  . Zoster Recombinat (Shingrix) 10/21/2017  . Zoster, Live 03/17/2016    Conditions to be addressed/monitored:  Hypertension and Hyperlipidemia  There are no care plans that you recently modified to display for this patient.    Medication Assistance: Recieved first month of medication from MM clinic. Given paperwork for enrollment.  Patient's preferred pharmacy is:  [facility], [place] [address] [place] [address] Phone: [phone] Fax: [phone]  [id number] [place] [place]: [phone]: [phone] [facility] [place]Phone: [phone]: [phone]  Plan: The patient has been provided with contact information for the care management team and has been advised to call with any health related questions or concerns.   JJunita Push HKenton KingfisherPharmD, [phone] Current Barriers:  . Unable to independently afford treatment regimen  Pharmacist Clinical Goal(s):  .Marland KitchenPatient will verbalize ability to afford treatment regimen through collaboration with PharmD and provider.   Interventions: . 1:1 collaboration with CVenita Lick NP regarding development and update of comprehensive plan  of care as evidenced by provider attestation and co-signature . Inter-disciplinary care team collaboration (see longitudinal plan of care) . Comprehensive medication review performed; medication list updated in electronic medical record  Hypertension (BP goal <130/80) -Controlled -Current treatment: . Losartan 50 mg qd . Amlodipine 10 mg qd . Propranolol 20 mg qd -Medications previously tried: NA -Current home readings:unable to provide specific readings stating readings are "good" --Denies hypotensive/hypertensive symptoms -Educated on BP goals and benefits of medications for prevention of heart attack, stroke and kidney damage; Daily salt intake goal < 2300 mg; Importance of home blood pressure monitoring; Proper BP monitoring technique; Symptoms of hypotension and importance of maintaining adequate hydration; -Counseled to monitor BP at home three times weekly, document, and provide log at future appointments -Recommended to continue current  medication Collaborated with RNCM, LCSW for community resources to assist patient in obtaining medicare coverage. Assessed patient understanding of enrollment process and need to provide paperwork to MM clinic before he runs out of medication. Assessed patient finances. Currently has all his needed medications.   Hyperlipidemia: (LDL goal <70 ) -Controlled -Current treatment: . Atorvastatin 40 mg qd -Medications previously tried: na  --Educated on Cholesterol goals;  Importance of limiting foods high in cholesterol; -Recommended to continue current medication   Patient Goals/Self-Care Activities . Patient will:  - take medications as prescribed check blood pressure three times weekly, document, and provide at future appointments collaborate with provider on medication access solutions Complete paperwork and return to MM clinic. Contact if assistance needed with paperwork.  Follow Up Plan: The patient has been provided with contact information for the care management team and has been advised to call with any health related questions or concerns.   Junita Push. Kenton Kingfisher PharmD, [place] [facility] [phone]

## 2020-12-01 ENCOUNTER — Other Ambulatory Visit: Payer: Self-pay

## 2020-12-01 ENCOUNTER — Ambulatory Visit (INDEPENDENT_AMBULATORY_CARE_PROVIDER_SITE_OTHER): Payer: Self-pay | Admitting: Nurse Practitioner

## 2020-12-01 ENCOUNTER — Encounter: Payer: Self-pay | Admitting: Nurse Practitioner

## 2020-12-01 VITALS — BP 130/78 | HR 65 | Ht 68.43 in | Wt 173.2 lb

## 2020-12-01 DIAGNOSIS — Z5989 Other problems related to housing and economic circumstances: Secondary | ICD-10-CM

## 2020-12-01 DIAGNOSIS — F0390 Unspecified dementia without behavioral disturbance: Secondary | ICD-10-CM | POA: Insufficient documentation

## 2020-12-01 DIAGNOSIS — E7800 Pure hypercholesterolemia, unspecified: Secondary | ICD-10-CM

## 2020-12-01 DIAGNOSIS — R413 Other amnesia: Secondary | ICD-10-CM

## 2020-12-01 DIAGNOSIS — I10 Essential (primary) hypertension: Secondary | ICD-10-CM

## 2020-12-01 DIAGNOSIS — F0280 Dementia in other diseases classified elsewhere without behavioral disturbance: Secondary | ICD-10-CM | POA: Insufficient documentation

## 2020-12-01 DIAGNOSIS — R251 Tremor, unspecified: Secondary | ICD-10-CM

## 2020-12-01 MED ORDER — DONEPEZIL HCL 5 MG PO TABS
5.0000 mg | ORAL_TABLET | Freq: Every day | ORAL | 4 refills | Status: DC
  Filled 2020-12-01: qty 14, 14d supply, fill #0
  Filled 2020-12-18: qty 90, 90d supply, fill #1

## 2020-12-01 MED ORDER — PROPRANOLOL HCL 20 MG PO TABS
20.0000 mg | ORAL_TABLET | Freq: Every day | ORAL | 4 refills | Status: DC
  Filled 2020-12-01: qty 14, 14d supply, fill #0
  Filled 2020-12-18: qty 90, 90d supply, fill #1

## 2020-12-01 NOTE — Assessment & Plan Note (Signed)
Chronic, ongoing.  Continue current medication regimen and adjust as needed. Lipid panel today. 

## 2020-12-01 NOTE — Patient Instructions (Signed)
Donepezil tablets What is this medicine? DONEPEZIL (doe NEP e zil) is used to treat mild to moderate dementia caused by Alzheimer's disease. This medicine may be used for other purposes; ask your health care provider or pharmacist if you have questions. COMMON BRAND NAME(S): Aricept What should I tell my health care provider before I take this medicine? They need to know if you have any of these conditions:  asthma or other lung disease  difficulty passing urine  head injury  heart disease  history of irregular heartbeat  liver disease  seizures (convulsions)  stomach or intestinal disease, ulcers or stomach bleeding  an unusual or allergic reaction to donepezil, other medicines, foods, dyes, or preservatives  pregnant or trying to get pregnant  breast-feeding How should I use this medicine? Take this medicine by mouth with a glass of water. Follow the directions on the prescription label. You may take this medicine with or without food. Take this medicine at regular intervals. This medicine is usually taken before bedtime. Do not take it more often than directed. Continue to take your medicine even if you feel better. Do not stop taking except on your doctor's advice. If you are taking the 23 mg donepezil tablet, swallow it whole; do not cut, crush, or chew it. Talk to your pediatrician regarding the use of this medicine in children. Special care may be needed. Overdosage: If you think you have taken too much of this medicine contact a poison control center or emergency room at once. NOTE: This medicine is only for you. Do not share this medicine with others. What if I miss a dose? If you miss a dose, take it as soon as you can. If it is almost time for your next dose, take only that dose, do not take double or extra doses. What may interact with this medicine? Do not take this medicine with any of the following medications:  certain medicines for fungal infections like  itraconazole, fluconazole, posaconazole, and voriconazole  cisapride  dextromethorphan; quinidine  dronedarone  pimozide  quinidine  thioridazine This medicine may also interact with the following medications:  antihistamines for allergy, cough and cold  atropine  bethanechol  carbamazepine  certain medicines for bladder problems like oxybutynin, tolterodine  certain medicines for Parkinson's disease like benztropine, trihexyphenidyl  certain medicines for stomach problems like dicyclomine, hyoscyamine  certain medicines for travel sickness like scopolamine  dexamethasone  dofetilide  ipratropium  NSAIDs, medicines for pain and inflammation, like ibuprofen or naproxen  other medicines for Alzheimer's disease  other medicines that prolong the QT interval (cause an abnormal heart rhythm)  phenobarbital  phenytoin  rifampin, rifabutin or rifapentine  ziprasidone This list may not describe all possible interactions. Give your health care provider a list of all the medicines, herbs, non-prescription drugs, or dietary supplements you use. Also tell them if you smoke, drink alcohol, or use illegal drugs. Some items may interact with your medicine. What should I watch for while using this medicine? Visit your doctor or health care professional for regular checks on your progress. Check with your doctor or health care professional if your symptoms do not get better or if they get worse. You may get drowsy or dizzy. Do not drive, use machinery, or do anything that needs mental alertness until you know how this drug affects you. What side effects may I notice from receiving this medicine? Side effects that you should report to your doctor or health care professional as soon as possible:    allergic reactions like skin rash, itching or hives, swelling of the face, lips, or tongue  feeling faint or lightheaded, falls  loss of bladder control  seizures  signs and  symptoms of a dangerous change in heartbeat or heart rhythm like chest pain; dizziness; fast or irregular heartbeat; palpitations; feeling faint or lightheaded, falls; breathing problems  signs and symptoms of infection like fever or chills; cough; sore throat; pain or trouble passing urine  signs and symptoms of liver injury like dark yellow or brown urine; general ill feeling or flu-like symptoms; light-colored stools; loss of appetite; nausea; right upper belly pain; unusually weak or tired; yellowing of the eyes or skin  slow heartbeat or palpitations  unusual bleeding or bruising  vomiting Side effects that usually do not require medical attention (report to your doctor or health care professional if they continue or are bothersome):  diarrhea, especially when starting treatment  headache  loss of appetite  muscle cramps  nausea  stomach upset This list may not describe all possible side effects. Call your doctor for medical advice about side effects. You may report side effects to FDA at 1-800-FDA-1088. Where should I keep my medicine? Keep out of reach of children. Store at room temperature between 15 and 30 degrees C (59 and 86 degrees F). Throw away any unused medicine after the expiration date. NOTE: This sheet is a summary. It may not cover all possible information. If you have questions about this medicine, talk to your doctor, pharmacist, or health care provider.  2021 Elsevier/Gold Standard (2018-06-05 10:33:41)

## 2020-12-01 NOTE — Assessment & Plan Note (Signed)
Improved on exam today with Propranolol on board.  Ongoing for several years -- suspect more essential tremor vs PD due to benefit from BB + no rigidity, gait disturbance, or flat affect.  At this time will continue Propranolol 20 MG, which is offering benefit.  Referral to neurology placed for further assessment and MRI brain ordered due to memory changes over past year.  CMP and TSH today.  Return in 4 weeks.

## 2020-12-01 NOTE — Progress Notes (Signed)
BP 130/78 (BP Location: Left Arm)   Pulse 65   Ht 5' 8.43" (1.738 m)   Wt 173 lb 4 oz (78.6 kg)   SpO2 97%   BMI 26.02 kg/m    Subjective:    Patient ID: Michael Zuniga, male    DOB: 1951-03-29, 70 y.o.   MRN: [id number]  HPI: Michael Zuniga is a 70 y.o. male  Chief Complaint  Patient presents with  . Tremors   TREMOR Noted on recent visit, bilaterally to both hands.  Reports ongoing for several years intermittently.  Started trial of Propranolol 20 MG last visit, 11/03/20.  He reports improvement in tremor with this medication on board.  Denies ever heavy drinking.  Drinks a beer or two here and there, but not daily.    His wife presents with him today and reports she has noticed he is struggling with memory, which has gotten worse since he had Covid.  Had Covid around October.  She reports noticing it about one year before Covid, but worsening post Covid.  Difficulty remembering tasks, including items in fridge.  Wife reports normal gait at home (walks fast), no recent falls, no slurred speech, weakness, headaches, or change in facial symmetry noted. Aspirin: yes Recurrent headaches: no Visual changes: no Palpitations: no Dyspnea: no Chest pain: no Lower extremity edema: no Dizzy/lightheaded: no  6CIT Screen 12/01/2020 04/16/2020 03/06/2019  What Year? 4 points 0 points 0 points  What month? 3 points 0 points 0 points  What time? 3 points 0 points 0 points  Count back from 20 4 points 0 points 0 points  Months in reverse 4 points 0 points 0 points  Repeat phrase 10 points 0 points 0 points  Total Score 28 0 0    Relevant past medical, surgical, family and social history reviewed and updated as indicated. Interim medical history since our last visit reviewed. Allergies and medications reviewed and updated.  Review of Systems  Constitutional: Negative for activity change, diaphoresis, fatigue and fever.  Respiratory: Negative for cough, chest tightness, shortness of breath and  wheezing.   Cardiovascular: Negative for chest pain, palpitations and leg swelling.  Gastrointestinal: Negative.   Neurological: Positive for tremors. Negative for dizziness, seizures, facial asymmetry, speech difficulty, weakness and headaches.  Psychiatric/Behavioral: Positive for confusion (memory changes). Negative for self-injury, sleep disturbance and suicidal ideas. The patient is not nervous/anxious.     Per HPI unless specifically indicated above     Objective:    BP 130/78 (BP Location: Left Arm)   Pulse 65   Ht 5' 8.43" (1.738 m)   Wt 173 lb 4 oz (78.6 kg)   SpO2 97%   BMI 26.02 kg/m   Wt Readings from Last 3 Encounters:  12/01/20 173 lb 4 oz (78.6 kg)  11/03/20 167 lb 3.2 oz (75.8 kg)  04/16/20 173 lb (78.5 kg)    Physical Exam Vitals and nursing note reviewed.  Constitutional:      General: He is awake. He is not in acute distress.    Appearance: He is well-developed and well-groomed. He is not ill-appearing.  HENT:     Head: Normocephalic and atraumatic.     Right Ear: Hearing normal. No drainage.     Left Ear: Hearing normal. No drainage.  Eyes:     General: Lids are normal.        Right eye: No discharge.        Left eye: No discharge.  Conjunctiva/sclera: Conjunctivae normal.     Pupils: Pupils are equal, round, and reactive to light.  Neck:     Vascular: No carotid bruit.  Cardiovascular:     Rate and Rhythm: Normal rate and regular rhythm.     Heart sounds: Normal heart sounds, S1 normal and S2 normal. No murmur heard. No gallop.   Pulmonary:     Effort: Pulmonary effort is normal. No accessory muscle usage or respiratory distress.     Breath sounds: Normal breath sounds.  Abdominal:     General: Bowel sounds are normal.     Palpations: Abdomen is soft.  Musculoskeletal:        General: Normal range of motion.     Cervical back: Normal range of motion and neck supple.     Right lower leg: No edema.     Left lower leg: No edema.  Skin:     General: Skin is warm and dry.  Neurological:     Mental Status: He is alert.     Cranial Nerves: Cranial nerves are intact.     Motor: Tremor (bilateral upper extremities, very mild and intermittent, improved from previous exam) present. No weakness or atrophy.     Coordination: Romberg sign negative. Finger-Nose-Finger Test and Heel to [place] Test normal.     Gait: Gait is intact.     Deep Tendon Reflexes: Reflexes are normal and symmetric.     Reflex Scores:      Brachioradialis reflexes are 2+ on the right side and 2+ on the left side.      Patellar reflexes are 2+ on the right side.    Comments: Normal gait, no shuffle or shortened gait.  Tremor bilateral upper extremity -- L>R, improved on this exam -- minimal present.  No rigidity on exam.  No flat affect.  Not oriented to year, month, or day -- aware at provider office.  Psychiatric:        Attention and Perception: Attention normal.        Mood and Affect: Mood normal.        Speech: Speech normal.        Behavior: Behavior normal. Behavior is cooperative.     Results for orders placed or performed in visit on 05/27/20  VITAMIN D 25 Hydroxy (Vit-D Deficiency, Fractures)  Result Value Ref Range   Vit D, 25-Hydroxy 44.5 30.0 - 100.0 ng/mL  PTH, Intact and Calcium  Result Value Ref Range   Calcium 9.6 8.6 - 10.2 mg/dL   PTH 35 15 - 65 pg/mL   PTH Interp Comment   Hepatic function panel  Result Value Ref Range   Total Protein 6.9 6.0 - 8.5 g/dL   Albumin 4.5 3.7 - 4.7 g/dL   Bilirubin Total 0.4 0.0 - 1.2 mg/dL   Bilirubin, Direct 0.14 0.00 - 0.40 mg/dL   Alkaline Phosphatase 78 44 - 121 IU/L   AST 30 0 - 40 IU/L   ALT 34 0 - 44 IU/L      Assessment & Plan:   Problem List Items Addressed This Visit      Cardiovascular and Mediastinum   Hypertension    Chronic, ongoing. BP at goal in office on recheck today.  Recommend he monitor BP at least a few mornings a week at home and document.  DASH diet at home.  Continue  current medication regimen and adjust as needed, to medication management.  Labs: CMP, lipid, and TSH.  Return in 4 weeks.  Relevant Medications   propranolol (INDERAL) 20 MG tablet   Other Relevant Orders   Lipid Panel w/o Chol/HDL Ratio     Other   Hyperlipidemia    Chronic, ongoing.  Continue current medication regimen and adjust as needed.  Lipid panel today.      Relevant Medications   propranolol (INDERAL) 20 MG tablet   Other Relevant Orders   CBC with Differential/Platelet   Comprehensive metabolic panel   Does not have health insurance    Currently without insurance -- urgent referral to community care to assist with possible bills at home and current medical bills.  May benefit from working on [employer].      Relevant Orders   [facility]B Referral to [facility]   Tremor of both hands    Improved on exam today with Propranolol on board.  Ongoing for several years -- suspect more essential tremor vs PD due to benefit from BB + no rigidity, gait disturbance, or flat affect.  At this time will continue Propranolol 20 MG, which is offering benefit.  Referral to neurology placed for further assessment and MRI brain ordered due to memory changes over past year.  CMP and TSH today.  Return in 4 weeks.      Memory changes - Primary    Over past year per wife report with worsening after Covid in October 2021.  6CIT = 28, poor recall and not able to state date (reports year as 10).  Obtain labs today CMP, TSH, CBC, B12, RPR. Referral to neurology placed.  Will start Aricept at 5 MG and adjust accordingly, sent to medical management.  Referral to community care to assist with bills and medical bills if needed, as patient currently with no insurance.  MRI ordered, may need assist with cost.  Return in 4 weeks.      Relevant Orders   CBC with Differential/Platelet   Comprehensive metabolic panel   Lipid Panel w/o Chol/HDL Ratio   TSH   Vitamin B12   RPR   AMB  Referral to [facility]   Ambulatory referral to Neurology       Follow up plan: Return in about 4 weeks (around 12/29/2020) for Memory changes.

## 2020-12-01 NOTE — Assessment & Plan Note (Signed)
Currently without insurance -- urgent referral to community care to assist with possible bills at home and current medical bills.  May benefit from working on [employer].

## 2020-12-01 NOTE — Assessment & Plan Note (Addendum)
Chronic, ongoing. BP at goal in office on recheck today.  Recommend he monitor BP at least a few mornings a week at home and document.  DASH diet at home.  Continue current medication regimen and adjust as needed, to medication management.  Labs: CMP, lipid, and TSH.  Return in 4 weeks.

## 2020-12-01 NOTE — Assessment & Plan Note (Signed)
Over past year per wife report with worsening after Covid in October 2021.  6CIT = 28, poor recall and not able to state date (reports year as 44).  Obtain labs today CMP, TSH, CBC, B12, RPR. Referral to neurology placed.  Will start Aricept at 5 MG and adjust accordingly, sent to medical management.  Referral to community care to assist with bills and medical bills if needed, as patient currently with no insurance.  MRI ordered, may need assist with cost.  Return in 4 weeks.

## 2020-12-02 ENCOUNTER — Other Ambulatory Visit: Payer: Self-pay

## 2020-12-02 ENCOUNTER — Telehealth: Payer: Self-pay | Admitting: *Deleted

## 2020-12-02 LAB — COMPREHENSIVE METABOLIC PANEL
ALT: 57 IU/L — ABNORMAL HIGH (ref 0–44)
AST: 52 IU/L — ABNORMAL HIGH (ref 0–40)
Albumin/Globulin Ratio: 1.8 (ref 1.2–2.2)
Albumin: 4.8 g/dL — ABNORMAL HIGH (ref 3.7–4.7)
Alkaline Phosphatase: 95 IU/L (ref 44–121)
BUN/Creatinine Ratio: 12 (ref 10–24)
BUN: 15 mg/dL (ref 8–27)
Bilirubin Total: 0.5 mg/dL (ref 0.0–1.2)
CO2: 21 mmol/L (ref 20–29)
Calcium: 10 mg/dL (ref 8.6–10.2)
Chloride: 101 mmol/L (ref 96–106)
Creatinine, Ser: 1.26 mg/dL (ref 0.76–1.27)
Globulin, Total: 2.7 g/dL (ref 1.5–4.5)
Glucose: 100 mg/dL — ABNORMAL HIGH (ref 65–99)
Potassium: 4.2 mmol/L (ref 3.5–5.2)
Sodium: 141 mmol/L (ref 134–144)
Total Protein: 7.5 g/dL (ref 6.0–8.5)
eGFR: 61 mL/min/{1.73_m2} (ref >59)

## 2020-12-02 LAB — CBC WITH DIFFERENTIAL/PLATELET
Basophils Absolute: 0.1 10*3/uL (ref 0.0–0.2)
Basos: 1 %
EOS (ABSOLUTE): 0.1 10*3/uL (ref 0.0–0.4)
Eos: 2 %
Hematocrit: 41.8 % (ref 37.5–51.0)
Hemoglobin: 14.2 g/dL (ref 13.0–17.7)
Immature Grans (Abs): 0 10*3/uL (ref 0.0–0.1)
Immature Granulocytes: 1 %
Lymphocytes Absolute: 1.1 10*3/uL (ref 0.7–3.1)
Lymphs: 22 %
MCH: 32.3 pg (ref 26.6–33.0)
MCHC: 34 g/dL (ref 31.5–35.7)
MCV: 95 fL (ref 79–97)
Monocytes Absolute: 0.5 10*3/uL (ref 0.1–0.9)
Monocytes: 11 %
Neutrophils Absolute: 3.2 10*3/uL (ref 1.4–7.0)
Neutrophils: 63 %
Platelets: 272 10*3/uL (ref 150–450)
RBC: 4.39 x10E6/uL (ref 4.14–5.80)
RDW: 13.9 % (ref 11.6–15.4)
WBC: 5 10*3/uL (ref 3.4–10.8)

## 2020-12-02 LAB — RPR: RPR Ser Ql: NONREACTIVE

## 2020-12-02 LAB — TSH: TSH: 1.54 u[IU]/mL (ref 0.450–4.500)

## 2020-12-02 LAB — LIPID PANEL W/O CHOL/HDL RATIO
Cholesterol, Total: 192 mg/dL (ref 100–199)
HDL: 70 mg/dL (ref >39)
LDL Chol Calc (NIH): 103 mg/dL — ABNORMAL HIGH (ref 0–99)
Triglycerides: 109 mg/dL (ref 0–149)
VLDL Cholesterol Cal: 19 mg/dL (ref 5–40)

## 2020-12-02 LAB — VITAMIN B12: Vitamin B-12: 334 pg/mL (ref 232–1245)

## 2020-12-02 NOTE — Telephone Encounter (Signed)
   Telephone encounter was:  Unsuccessful.  12/02/2020 Name: Michael Zuniga MRN: [id number] DOB: 1950/07/18  Unsuccessful outbound call made today to assist with:  medicare insurance   Outreach Attempt:  1st Attempt  A HIPAA compliant voice message was left requesting a return call.  Instructed patient to call back at   Instructed patient to call back at [phone]  at their earliest convenience.   Ewa Gentry, Care Management  [phone] [address] , [place] [address] Email : [email or web address]

## 2020-12-02 NOTE — Progress Notes (Signed)
Good morning, please let patient's wife know his labs have returned: - CBC shows no anemia and kidney function normal. - Liver function labs are mildly elevated compared to last check -- recommend avoiding any alcohol intake and minimal use of Tylenol. - Thyroid lab is normal and syphilis testing is negative - B12 is on lower end of normal, I do recommend he start taking Vitamin B12 500 to 1000 MCG daily and will recheck this in 8 weeks. - Cholesterol levels slightly more elevated this check, but I assume he was not fasting.  Continue Atorvastatin daily and will recheck next visit.  Any questions? Keep being awesome!!  Thank you for allowing me to participate in your care.  I appreciate you. Kindest regards, Lamiyah Schlotter

## 2020-12-05 ENCOUNTER — Telehealth: Payer: Self-pay | Admitting: *Deleted

## 2020-12-05 NOTE — Telephone Encounter (Signed)
   Telephone encounter was:  Unsuccessful.  12/05/2020 Name: NEILS SIRACUSA MRN: [id number] DOB: [birth year]  Unsuccessful outbound call made today to assist with:  Transportation Needs  and Food Insecurity  Outreach Attempt:  2nd Attempt  A HIPAA compliant voice message was left requesting a return call.  Instructed patient to call back at   Instructed patient to call back at [phone]  at their earliest convenience. .  [place], Care Management  [phone] [address] , [place] [address] Email : [email or web address]

## 2020-12-08 ENCOUNTER — Telehealth: Payer: Self-pay | Admitting: *Deleted

## 2020-12-08 NOTE — Telephone Encounter (Signed)
   Telephone encounter was:  Successful.  12/08/2020 Name: Michael Zuniga MRN: [id number] DOB: 1951-02-09  Michael Zuniga is a 70 y.o. year old male who is a primary care patient of Michael Zuniga, Michael Faster, Michael Zuniga . The community resource team was consulted for assistance with Transportation Needs , Food Insecurity, and Caregiver Stress  Care guide performed the following interventions: Patient provided with information about care guide support team and interviewed to confirm resource needs Follow up call placed to community resources to determine status of patients referral.  Follow Up Plan:  No further follow up planned at this time. The patient has been provided with needed resources.  [place], Care Management  [phone] [address] , [place] [address] Email : [email or web address]

## 2020-12-11 ENCOUNTER — Other Ambulatory Visit: Payer: Self-pay

## 2020-12-17 ENCOUNTER — Other Ambulatory Visit: Payer: Self-pay

## 2020-12-17 ENCOUNTER — Ambulatory Visit: Payer: PRIVATE HEALTH INSURANCE | Admitting: Pharmacy Technician

## 2020-12-17 DIAGNOSIS — Z79899 Other long term (current) drug therapy: Secondary | ICD-10-CM

## 2020-12-17 NOTE — Progress Notes (Signed)
Completed [facility] application and contract.  Patient agreed to all terms of the [facility] contract.    Patient approved to receive medication assistance at [facility] until Medicare Part D becomes effective.    Provided patient with Civil engineer, contracting based on his particular needs.    Provided patient contact information for [facility].  [facility]

## 2020-12-18 ENCOUNTER — Other Ambulatory Visit: Payer: Self-pay

## 2020-12-26 ENCOUNTER — Telehealth: Payer: Medicaid Other

## 2020-12-26 ENCOUNTER — Telehealth: Payer: Self-pay | Admitting: General Practice

## 2020-12-26 NOTE — Telephone Encounter (Signed)
  Care Management   Follow Up Note   12/26/2020 Name: HERMON ZEA MRN: [id number] DOB: Apr 05, 1951   Referred by: Venita Lick, NP Reason for referral : Care Coordination (RNCM: Initial Outreach for Chronic Disease Management and Care Coordination Needs )   An unsuccessful telephone outreach was attempted today. The patient was referred to the case management team for assistance with care management and care coordination. Attempt x 3  Follow Up Plan: A HIPPA compliant phone message was left for the patient providing contact information and requesting a return call.   Noreene Larsson RN, MSN, [facility] Mobile: [phone]

## 2020-12-29 ENCOUNTER — Encounter: Payer: Self-pay | Admitting: Nurse Practitioner

## 2020-12-29 DIAGNOSIS — E538 Deficiency of other specified B group vitamins: Secondary | ICD-10-CM | POA: Insufficient documentation

## 2020-12-29 DIAGNOSIS — R7989 Other specified abnormal findings of blood chemistry: Secondary | ICD-10-CM | POA: Insufficient documentation

## 2020-12-30 ENCOUNTER — Telehealth: Payer: Self-pay | Admitting: Licensed Clinical Social Worker

## 2020-12-30 ENCOUNTER — Ambulatory Visit (INDEPENDENT_AMBULATORY_CARE_PROVIDER_SITE_OTHER): Payer: Self-pay | Admitting: Nurse Practitioner

## 2020-12-30 ENCOUNTER — Encounter: Payer: Self-pay | Admitting: Nurse Practitioner

## 2020-12-30 ENCOUNTER — Other Ambulatory Visit: Payer: Self-pay

## 2020-12-30 VITALS — BP 121/76 | HR 52 | Temp 98.1°F | Wt 163.8 lb

## 2020-12-30 DIAGNOSIS — R413 Other amnesia: Secondary | ICD-10-CM

## 2020-12-30 DIAGNOSIS — I10 Essential (primary) hypertension: Secondary | ICD-10-CM

## 2020-12-30 DIAGNOSIS — E7800 Pure hypercholesterolemia, unspecified: Secondary | ICD-10-CM

## 2020-12-30 DIAGNOSIS — R251 Tremor, unspecified: Secondary | ICD-10-CM

## 2020-12-30 DIAGNOSIS — E538 Deficiency of other specified B group vitamins: Secondary | ICD-10-CM

## 2020-12-30 DIAGNOSIS — Z5989 Other problems related to housing and economic circumstances: Secondary | ICD-10-CM

## 2020-12-30 NOTE — Telephone Encounter (Signed)
    Clinical Social Work  Care Management   Phone Outreach    12/30/2020 Name: Michael Zuniga MRN: [id number] DOB: 01-21-1951  [name] is a 70 y.o. year old male who is a primary care patient of Cannady, Barbaraann Faster, NP .   CCM LCSW reached out to patient today by phone to introduce self, assess needs and offer Care Management services and interventions.    Telephone outreach was unsuccessful A HIPPA compliant phone message was left for the patient providing contact information and requesting a return call.   Plan:CCM LCSW will wait for return call. If no return call is received, Will reach out to patient again in the next 14 days .   Review of patient status, including review of consultants reports, relevant laboratory and other test results, and collaboration with appropriate care team members and the patient's provider was performed as part of comprehensive patient evaluation and provision of care management services.    Christa See, MSW, [email or web address] Phone [phone] 1:44 PM

## 2020-12-30 NOTE — Assessment & Plan Note (Signed)
Chronic, ongoing. BP at goal in office today.  Recommend he monitor BP at least a few mornings a week at home and document.  DASH diet at home.  Continue current medication regimen and adjust as needed, to medication management.  Labs: up to date.  Return in 6 weeks.

## 2020-12-30 NOTE — Assessment & Plan Note (Signed)
Chronic, ongoing.  Continue current medication regimen and adjust as needed.  Lipid panel up to date. 

## 2020-12-30 NOTE — Assessment & Plan Note (Signed)
Currently without insurance -- will continue to work with community care and CCM as need into neurology and MRI as soon as possible.

## 2020-12-30 NOTE — Patient Instructions (Signed)

## 2020-12-30 NOTE — Assessment & Plan Note (Signed)
Noted on recent labs, wife has started him on supplement.  Continue this and recheck level in 6 weeks at visit.

## 2020-12-30 NOTE — Progress Notes (Signed)
BP 121/76   Pulse (!) 52   Temp 98.1 F (36.7 C) (Oral)   Wt 163 lb 12.8 oz (74.3 kg)   SpO2 95%   BMI 24.60 kg/m    Subjective:    Patient ID: Michael Zuniga, male    DOB: 01/08/1951, 70 y.o.   MRN: [id number]  HPI: Michael Zuniga is a 70 y.o. male  Chief Complaint  Patient presents with   Memory Changes    Patient denies having any problems or concerns at today's visit.   TREMOR & MEMORY CHANGES Ongoing for some time and recently started memory changes -- ordered MRI And neurology referral but they are waiting on insurance coverage to schedule -- CCM and community care referrals in as is self pay.  Reports ongoing issues for several years intermittently with tremor.  Started trial of Propranolol 20 MG last visit, 11/03/20.  He reports improvement in tremor with this medication on board and less arm pain.  Denies ever heavy drinking.  Not currently drinking any alcohol.  His wife presents with him today and reports she has noticed he continues struggling with memory, which has gotten worse since he had Covid.  Added on Aricept recent visit and is tolerating.  Had Covid around October 2021.  Reports noticing it about one year before Covid, but became worse after Covid.  Difficulty remembering tasks, including items in fridge.  She reports noticing changes in his routine.  Wife reports normal gait at home (walks fast), no recent falls, no slurred speech, weakness, headaches, or change in facial symmetry noted.    His B12 level recent visit was 334, wife did start him on supplement. Aspirin: yes Recurrent headaches: no Visual changes: no Palpitations: no Dyspnea: no Chest pain: no Lower extremity edema: no Dizzy/lightheaded: no  6CIT Screen 12/30/2020 12/01/2020 04/16/2020 03/06/2019  What Year? 0 points 4 points 0 points 0 points  What month? 0 points 3 points 0 points 0 points  What time? 3 points 3 points 0 points 0 points  Count back from 20 4 points 4 points 0 points 0 points   Months in reverse 4 points 4 points 0 points 0 points  Repeat phrase 10 points 10 points 0 points 0 points  Total Score 21 28 0 0    Relevant past medical, surgical, family and social history reviewed and updated as indicated. Interim medical history since our last visit reviewed. Allergies and medications reviewed and updated.  Review of Systems  Constitutional:  Negative for activity change, diaphoresis, fatigue and fever.  Respiratory:  Negative for cough, chest tightness, shortness of breath and wheezing.   Cardiovascular:  Negative for chest pain, palpitations and leg swelling.  Gastrointestinal: Negative.   Neurological:  Positive for tremors. Negative for dizziness, seizures, facial asymmetry, speech difficulty, weakness and headaches.  Psychiatric/Behavioral:  Positive for confusion (memory changes). Negative for self-injury, sleep disturbance and suicidal ideas. The patient is not nervous/anxious.    Per HPI unless specifically indicated above     Objective:    BP 121/76   Pulse (!) 52   Temp 98.1 F (36.7 C) (Oral)   Wt 163 lb 12.8 oz (74.3 kg)   SpO2 95%   BMI 24.60 kg/m   Wt Readings from Last 3 Encounters:  12/30/20 163 lb 12.8 oz (74.3 kg)  12/01/20 173 lb 4 oz (78.6 kg)  11/03/20 167 lb 3.2 oz (75.8 kg)    Physical Exam Vitals and nursing note reviewed.  Constitutional:  General: He is awake. He is not in acute distress.    Appearance: He is well-developed and well-groomed. He is not ill-appearing.  HENT:     Head: Normocephalic and atraumatic.     Right Ear: Hearing normal. No drainage.     Left Ear: Hearing normal. No drainage.  Eyes:     General: Lids are normal.        Right eye: No discharge.        Left eye: No discharge.     Conjunctiva/sclera: Conjunctivae normal.     Pupils: Pupils are equal, round, and reactive to light.  Neck:     Vascular: No carotid bruit.  Cardiovascular:     Rate and Rhythm: Normal rate and regular rhythm.      Heart sounds: Normal heart sounds, S1 normal and S2 normal. No murmur heard.   No gallop.  Pulmonary:     Effort: Pulmonary effort is normal. No accessory muscle usage or respiratory distress.     Breath sounds: Normal breath sounds.  Abdominal:     General: Bowel sounds are normal.     Palpations: Abdomen is soft.  Musculoskeletal:        General: Normal range of motion.     Cervical back: Normal range of motion and neck supple.     Right lower leg: No edema.     Left lower leg: No edema.  Skin:    General: Skin is warm and dry.  Neurological:     Mental Status: He is alert.     Cranial Nerves: Cranial nerves are intact.     Motor: No weakness, tremor (none noted today, stable) or atrophy.     Coordination: Romberg sign negative. Finger-Nose-Finger Test and Heel to [employer] Test normal.     Gait: Gait is intact.     Deep Tendon Reflexes: Reflexes are normal and symmetric.     Reflex Scores:      Brachioradialis reflexes are 2+ on the right side and 2+ on the left side.      Patellar reflexes are 2+ on the right side.    Comments: Normal gait, no shuffle or shortened gait.  No tremor noted today, active or resting.  No rigidity on exam.  No flat affect.  Not oriented to year, month, or day -- aware he is at provider office.  Psychiatric:        Attention and Perception: Attention normal.        Mood and Affect: Mood normal.        Speech: Speech normal.        Behavior: Behavior normal. Behavior is cooperative.    Results for orders placed or performed in visit on 12/01/20  CBC with Differential/Platelet  Result Value Ref Range   WBC 5.0 3.4 - 10.8 x10E3/uL   RBC 4.39 4.14 - 5.80 x10E6/uL   Hemoglobin 14.2 13.0 - 17.7 g/dL   Hematocrit 41.8 37.5 - 51.0 %   MCV 95 79 - 97 fL   MCH 32.3 26.6 - 33.0 pg   MCHC 34.0 31.5 - 35.7 g/dL   RDW 13.9 11.6 - 15.4 %   Platelets 272 150 - 450 x10E3/uL   Neutrophils 63 Not Estab. %   Lymphs 22 Not Estab. %   Monocytes 11 Not Estab. %    Eos 2 Not Estab. %   Basos 1 Not Estab. %   Neutrophils Absolute 3.2 1.4 - 7.0 x10E3/uL   Lymphocytes Absolute 1.1 0.7 -  3.1 x10E3/uL   Monocytes Absolute 0.5 0.1 - 0.9 x10E3/uL   EOS (ABSOLUTE) 0.1 0.0 - 0.4 x10E3/uL   Basophils Absolute 0.1 0.0 - 0.2 x10E3/uL   Immature Granulocytes 1 Not Estab. %   Immature Grans (Abs) 0.0 0.0 - 0.1 x10E3/uL  Comprehensive metabolic panel  Result Value Ref Range   Glucose 100 (H) 65 - 99 mg/dL   BUN 15 8 - 27 mg/dL   Creatinine, Ser 1.26 0.76 - 1.27 mg/dL   eGFR 61 >59 mL/min/1.73   BUN/Creatinine Ratio 12 10 - 24   Sodium 141 134 - 144 mmol/L   Potassium 4.2 3.5 - 5.2 mmol/L   Chloride 101 96 - 106 mmol/L   CO2 21 20 - 29 mmol/L   Calcium 10.0 8.6 - 10.2 mg/dL   Total Protein 7.5 6.0 - 8.5 g/dL   Albumin 4.8 (H) 3.7 - 4.7 g/dL   Globulin, Total 2.7 1.5 - 4.5 g/dL   Albumin/Globulin Ratio 1.8 1.2 - 2.2   Bilirubin Total 0.5 0.0 - 1.2 mg/dL   Alkaline Phosphatase 95 44 - 121 IU/L   AST 52 (H) 0 - 40 IU/L   ALT 57 (H) 0 - 44 IU/L  Lipid Panel w/o Chol/HDL Ratio  Result Value Ref Range   Cholesterol, Total 192 100 - 199 mg/dL   Triglycerides 109 0 - 149 mg/dL   HDL 70 >39 mg/dL   VLDL Cholesterol Cal 19 5 - 40 mg/dL   LDL Chol Calc (NIH) 103 (H) 0 - 99 mg/dL  TSH  Result Value Ref Range   TSH 1.540 0.450 - 4.500 uIU/mL  Vitamin B12  Result Value Ref Range   Vitamin B-12 334 232 - 1,245 pg/mL  RPR  Result Value Ref Range   RPR Ser Ql Non Reactive Non Reactive      Assessment & Plan:   Problem List Items Addressed This Visit       Cardiovascular and Mediastinum   Hypertension    Chronic, ongoing. BP at goal in office today.  Recommend he monitor BP at least a few mornings a week at home and document.  DASH diet at home.  Continue current medication regimen and adjust as needed, to medication management.  Labs: up to date.  Return in 6 weeks.          Other   Hyperlipidemia    Chronic, ongoing.  Continue current medication  regimen and adjust as needed.  Lipid panel up to date.       Does not have health insurance    Currently without insurance -- will continue to work with community care and CCM as need into neurology and MRI as soon as possible.       Tremor of both hands    Ongoing for several years -- ?PD vs essential, no rigidity, gait disturbance, or flat affect.  Has improved with Propranolol, suspect more essential tremor.  Will work on obtaining MRI and neurology visit, as ordered last visit, but cost an issue -- work with CCM and community care.  Return in 6 weeks.       Memory changes - Primary    Over past year per wife report with worsening after Covid in October 2021.  6CIT = 28 last visit and 21 today, poor recall and not able to state month or year.  Recent labs stable with exception of mild low B12, started supplement.  Referral to neurology placed last visit and MRI ordered, but has not  obtained or scheduled due to cost concerns -- will reach out to CCM team for update.  Will continue Aricept at 5 MG and adjust accordingly.  Return in 6 weeks.       B12 deficiency    Noted on recent labs, wife has started him on supplement.  Continue this and recheck level in 6 weeks at visit.         Follow up plan: Return in about 6 weeks (around 02/10/2021) for Memory changes.

## 2020-12-30 NOTE — Assessment & Plan Note (Signed)
Over past year per wife report with worsening after Covid in October 2021.  6CIT = 28 last visit and 21 today, poor recall and not able to state month or year.  Recent labs stable with exception of mild low B12, started supplement.  Referral to neurology placed last visit and MRI ordered, but has not obtained or scheduled due to cost concerns -- will reach out to CCM team for update.  Will continue Aricept at 5 MG and adjust accordingly.  Return in 6 weeks.

## 2020-12-30 NOTE — Assessment & Plan Note (Signed)
Ongoing for several years -- ?PD vs essential, no rigidity, gait disturbance, or flat affect.  Has improved with Propranolol, suspect more essential tremor.  Will work on obtaining MRI and neurology visit, as ordered last visit, but cost an issue -- work with CCM and community care.  Return in 6 weeks.

## 2021-01-01 ENCOUNTER — Other Ambulatory Visit: Payer: Self-pay

## 2021-01-01 NOTE — Progress Notes (Signed)
Michael Zuniga   I scheduled this pt 01/09/2021 At 11:45 for 64min if you need me to change it or reschedule someone else let me know

## 2021-01-09 ENCOUNTER — Telehealth: Payer: Self-pay | Admitting: General Practice

## 2021-01-09 ENCOUNTER — Ambulatory Visit: Payer: Self-pay | Admitting: General Practice

## 2021-01-09 DIAGNOSIS — I10 Essential (primary) hypertension: Secondary | ICD-10-CM

## 2021-01-09 DIAGNOSIS — R413 Other amnesia: Secondary | ICD-10-CM

## 2021-01-09 DIAGNOSIS — I2510 Atherosclerotic heart disease of native coronary artery without angina pectoris: Secondary | ICD-10-CM

## 2021-01-09 DIAGNOSIS — E7800 Pure hypercholesterolemia, unspecified: Secondary | ICD-10-CM

## 2021-01-09 NOTE — Patient Instructions (Signed)
Visit Information  PATIENT GOALS:   Goals Addressed             This Visit's Progress    RNCM: Enhance My Mental Skills       Timeframe:  Long-Range Goal Priority:  High Start Date:      01-09-2021                       Expected End Date:       01-09-2022                Follow Up Date 02/13/2021    - check out a senior citizen activity program - do word search or crossword puzzles daily - learn a new hobby like knitting or woodworking - read 1 new book each month - stay in touch with my family and friends - take a walk daily and think about what I am seeing - volunteer once a week in my community    Why is this important?   As we age, or sometimes because we have an illness, it feels like our memory and ability to figure things out is not very good.  There are things you can do to keep your memory and your thinking as strong as possible.    Notes: The patient with memory changes.      RNCM: Manage My Emotions       Timeframe:  Short-Term Goal Priority:  High Start Date:     01-09-2021                        Expected End Date:      06-26-2021                 Follow Up Date 02/13/2021    - call and visit an old friend - check out volunteer opportunities - join a support group - laugh; watch a funny movie or comedian - learn and use visualization or guided imagery - perform a random act of kindness - practice relaxation or meditation daily - start or continue a personal journal - talk about feelings with a friend, family or spiritual advisor - practice positive thinking and self-talk    Why is this important?   When you are stressed, down or upset, your body reacts too.  For example, your blood pressure may get higher; you may have a headache or stomachache.  When your emotions get the best of you, your body's ability to fight off cold and flu gets weak.  These steps will help you manage your emotions.     Notes: 01-09-2021: The patient no longer can work. The  patient's wife states he is doing okay but has noticed memory changes.         Mr. Kulish was given information about Care Management services by the embedded care coordination team including:  Care Management services include personalized support from designated clinical staff supervised by his physician, including individualized plan of care and coordination with other care providers 24/7 contact phone numbers for assistance for urgent and routine care needs. The patient may stop CCM services at any time (effective at the end of the month) by phone call to the office staff.  Patient agreed to services and verbal consent obtained.   The patient verbalized understanding of instructions, educational materials, and care plan provided today and declined offer to receive copy of patient instructions, educational materials, and care plan.   Telephone  follow up appointment with care management team member scheduled for: 02-13-2021 at 38 am  Noreene Larsson RN, MSN, [facility] Mobile: [phone]

## 2021-01-09 NOTE — Chronic Care Management (AMB) (Signed)
Care Management    RN Visit Note  01/09/2021 Name: Michael Zuniga MRN: [id number] DOB: 1950-07-23  Subjective: Michael Zuniga is a 70 y.o. year old male who is a primary care patient of Cannady, Barbaraann Faster, NP. The care management team was consulted for assistance with disease management and care coordination needs.    Engaged with patient by telephone for initial visit in response to provider referral for case management and/or care coordination services.   Consent to Services:   Michael Zuniga was given information about Care Management services today including:  Care Management services includes personalized support from designated clinical staff supervised by his physician, including individualized plan of care and coordination with other care providers 24/7 contact phone numbers for assistance for urgent and routine care needs. The patient may stop case management services at any time by phone call to the office staff.  Patient agreed to services and consent obtained.   Assessment: Review of patient past medical history, allergies, medications, health status, including review of consultants reports, laboratory and other test data, was performed as part of comprehensive evaluation and provision of chronic care management services.   SDOH (Social Determinants of Health) assessments and interventions performed:    Care Plan  No Known Allergies  Outpatient Encounter Medications as of 01/09/2021  Medication Sig   amLODipine (NORVASC) 10 MG tablet Take 1 tablet (10 mg total) by mouth daily.   aspirin EC 81 MG tablet Take 81 mg by mouth daily.   atorvastatin (LIPITOR) 40 MG tablet Take 1 tablet (40 mg total) by mouth daily.   donepezil (ARICEPT) 5 MG tablet Take 1 tablet (5 mg total) by mouth at bedtime.   losartan (COZAAR) 50 MG tablet Take 1 tablet (50 mg total) by mouth daily.   omeprazole (PRILOSEC) 40 MG capsule Take 1 capsule (40 mg total) by mouth 2 (two) times daily before a meal.    propranolol (INDERAL) 20 MG tablet Take 1 tablet (20 mg total) by mouth daily.   sildenafil (REVATIO) 20 MG tablet TAKE 1-5 TABLETS AS NEEDED   No facility-administered encounter medications on file as of 01/09/2021.    Patient Active Problem List   Diagnosis Date Noted   B12 deficiency 12/29/2020   Elevated LFTs 12/29/2020   Memory changes 12/01/2020   Does not have health insurance 11/03/2020   Tremor of both hands 11/03/2020   Esophageal dysphagia    Gastroesophageal reflux disease with esophagitis without hemorrhage    Schatzki's ring of distal esophagus    Raynaud phenomenon 04/21/2018   Carpal tunnel syndrome 04/21/2018   ED (erectile dysfunction) 12/01/2016   Hypertension 02/16/2016   Hyperlipidemia 02/16/2016   CAD (coronary artery disease) 02/16/2016    Conditions to be addressed/monitored: CAD, HTN, HLD, and Memory loss  Care Plan : RNCM: General Plan of care for chronic Disease ManagementL: HTN, HLD, CAD, and Memory Loss  Updates made by Vanita Ingles since 01/09/2021 12:00 AM     Problem: RNCM: Management of HTN, HLD, CAD, and Memory Changes   Priority: High  Onset Date: 01/09/2021     Long-Range Goal: RNCM: Management of HTN, HLD, CAD, and Memory Loss   Start Date: 01/09/2021  Expected End Date: 01/09/2022  This Visit's Progress: On track  Priority: High  Note:   Current Barriers:  Knowledge Deficits related to plan of care for management of CAD, HTN, HLD, and memory loss  Care Coordination needs related to Financial constraints related to no insurance with  medicare part B, Level of care concerns, Literacy concerns, Memory Deficits, and no insurance with medicare part B- filed paperwork but have not heard back from paperwork, do not know resources in a patient with CAD, HTN, HLD, and memory loss Chronic Disease Management support and education needs related to CAD, HTN, HLD, and memory loss Film/video editor.  Cognitive Deficits Does not have Medicare part  B insurance, spouse says she has filled out paperwork but has not heard anything. Does not know who to call. Will collaborate with LCSW for assistance   RNCM Clinical Goal(s):  Patient will verbalize understanding of plan for management of CAD, HTN, HLD, and memory loss work with RN Case Freight forwarder and Licensed Clinical Social Worker to address needs related to CAD, HTN, HLD, and memory loss and Financial constraints related to the need for medicare part B, Level of care concerns, and Memory Deficits take all medications exactly as prescribed and will call provider for medication related questions attend all scheduled medical appointments: 02-10-2021 at 240 pm demonstrate a decrease in CAD, HTN, HLD, and memory loss exacerbations demonstrate improved adherence to prescribed treatment plan for CAD, HTN, HLD, and memory loss demonstrate improved health management independence verbalize basic understanding of CAD, HTN, HLD, and memory loss disease process and self health management plan demonstrate understanding of rationale for each prescribed medication work with CM clinical social worker to assist with questions about follow up with Medicare part B and other resources to help with meeting needs of the patient  demonstrate ongoing self health care management ability through collaboration with Consulting civil engineer, provider, and care team.   Interventions: 1:1 collaboration with Marnee Guarneri, NP regarding development and update of comprehensive plan of care as evidenced by provider attestation and co-signature Inter-disciplinary care team collaboration (see longitudinal plan of care)   CAD  (Status: Goal on track: YES.) Assessed understanding of CAD diagnosis Medications reviewed including medications utilized in CAD treatment plan Provided education on importance of blood pressure control in management of CAD;  Hyperlipidemia:  (Status: Goal on track: YES.) Medication review performed; medication  list updated in electronic medical record.  Provider established cholesterol goals reviewed; Counseled on importance of regular laboratory monitoring as prescribed; Provided HLD educational materials; Reviewed role and benefits of statin for ASCVD risk reduction; Discussed strategies to manage statin-induced myalgias; Reviewed importance of limiting foods high in cholesterol; Lab Results  Component Value Date   CHOL 192 12/01/2020   CHOL 184 04/16/2020   CHOL 152 09/03/2019   Lab Results  Component Value Date   HDL 70 12/01/2020   HDL 102 04/16/2020   HDL 87 09/03/2019   Lab Results  Component Value Date   LDLCALC 103 (H) 12/01/2020   LDLCALC 72 04/16/2020   LDLCALC 55 09/03/2019   Lab Results  Component Value Date   TRIG 109 12/01/2020   TRIG 49 04/16/2020   TRIG 44 09/03/2019  No results found for: CHOLHDL No results found for: LDLDIRECT   Hypertension: (Status: Goal on track: YES.) Last practice recorded BP readings:  BP Readings from Last 3 Encounters:  12/30/20 121/76  12/01/20 130/78  11/03/20 129/83  Most recent eGFR/CrCl:  Lab Results  Component Value Date   EGFR 61 12/01/2020    No components found for: CRCL  Evaluation of current treatment plan related to hypertension self management and patient's adherence to plan as established by provider; Provided education to patient re: stroke prevention, s/s of heart attack and stroke; Reviewed medications with patient  and discussed importance of compliance; Counseled on the importance of exercise goals with target of 150 minutes per week Discussed plans with patient for ongoing care management follow up and provided patient with direct contact information for care management team; Advised patient, providing education and rationale, to monitor blood pressure daily and record, calling PCP for findings outside established parameters;  Reviewed scheduled/upcoming provider appointments including: 02-10-2021 at 240  pm Provided education on prescribed diet heart healthy diet;  Discussed complications of poorly controlled blood pressure such as heart disease, stroke, circulatory complications, vision complications, kidney impairment, sexual dysfunction;   Memory loss   (Status: Goal on track: YES.) Evaluation of current treatment plan related to  Memory loss , Memory Deficits self-management and patient's adherence to plan as established by provider. Discussed plans with patient for ongoing care management follow up and provided patient with direct contact information for care management team Evaluation of current treatment plan related to memory loss and changes  and patient's adherence to plan as established by provider; Advised patient to call the office for changes in memory or new concerns; Provided education to patient re: ways to enhance memory ; Reviewed medications with patient and discussed compliance ; Collaborated with LCSW and pcp  regarding memory changes, other chronic conditions and working with the CCM team to assist with needs of the patient, they have about 3 months of money to pay for housing etc but are concerned about not having medicare part B and will not be able to have a MRI until they know they will not have to pay for it; Reviewed scheduled/upcoming provider appointments including: 02-10-2021 at 240 pm; Social Work referral for assistance with Medicare B, ongoing support and education; Discussed plans with patient for ongoing care management follow up and provided patient with direct contact information for care management team; Screening for signs and symptoms of depression related to chronic disease state;  Assessed social determinant of health barriers;   Patient Goals/Self-Care Activities: Patient will self administer medications as prescribed Patient will attend all scheduled provider appointments Patient will call pharmacy for medication refills Patient will attend church or  other social activities Patient will continue to perform ADL's independently Patient will continue to perform IADL's independently Patient will call provider office for new concerns or questions Patient will work with BSW to address care coordination needs and will continue to work with the clinical team to address health care and disease management related needs.    Follow Up Plan: Telephone follow up appointment with care management team member scheduled for: 02-13-2021 at 63 am     Plan: Telephone follow up appointment with care management team member scheduled for:  02-13-2021 at 61 am  Noreene Larsson RN, MSN, [facility] Mobile: [phone]

## 2021-01-12 ENCOUNTER — Other Ambulatory Visit: Payer: Self-pay

## 2021-01-12 ENCOUNTER — Telehealth: Payer: Self-pay

## 2021-01-12 NOTE — Progress Notes (Signed)
Has been scheduled for 01/16/2021

## 2021-01-12 NOTE — Chronic Care Management (AMB) (Signed)
  Chronic Care Management   Note  01/12/2021 Name: Michael Zuniga MRN: [id number] DOB: 11-15-1950  Michael Zuniga is a 70 y.o. year old male who is a primary care patient of Cannady, Barbaraann Faster, NP. Michael Zuniga is currently enrolled in care management services. An additional referral for LCSW was placed.   Follow up plan: Telephone appointment with care management team member scheduled for:01/16/2021  Noreene Larsson, [place], [place], [place] [address] Direct Dial: [phone] [email or web address]: [email or web address]

## 2021-01-16 ENCOUNTER — Ambulatory Visit (INDEPENDENT_AMBULATORY_CARE_PROVIDER_SITE_OTHER): Payer: Medicare Other | Admitting: Licensed Clinical Social Worker

## 2021-01-16 DIAGNOSIS — R413 Other amnesia: Secondary | ICD-10-CM

## 2021-01-16 DIAGNOSIS — I10 Essential (primary) hypertension: Secondary | ICD-10-CM

## 2021-01-20 NOTE — Patient Instructions (Signed)
Visit Information   Goals Addressed             This Visit's Progress    Obtain Supportive Resources   On track    Timeframe:  Long-Range Goal Priority:  High Start Date:    01/16/21                         Expected End Date:   04/27/21                    Follow Up Date 01/26/21    Patient Goals/Self-Care Activities: Over the next 120 days Attend scheduled appointments Contact office with any questions or concerns Contact [facility] to inquire about Medicare Part B        Patient verbalizes understanding of instructions provided today.   Telephone follow up appointment with care management team member scheduled for:01/26/21  Christa See, MSW, [email or web address] Phone [phone] 11:24 AM

## 2021-01-20 NOTE — Chronic Care Management (AMB) (Signed)
Care Management Clinical Social Work Note  01/20/2021 Name: Michael Zuniga MRN: [id number] DOB: 04-26-1951  Michael Zuniga is a 70 y.o. year old male who is a primary care patient of Cannady, Barbaraann Faster, NP.  The Care Management team was consulted for assistance with chronic disease management and coordination needs.  Engaged with patient's spouse by telephone for initial visit in response to provider referral for social work chronic care management and care coordination services  Consent to Services:  Michael Zuniga was given information about Care Management services today including:  Care Management services includes personalized support from designated clinical staff supervised by his physician, including individualized plan of care and coordination with other care providers 24/7 contact phone numbers for assistance for urgent and routine care needs. The patient may stop case management services at any time by phone call to the office staff.  Patient agreed to services and consent obtained.   Assessment: Patient's spouse provided all information during this encounter. Family are on a fixed income and are unable to pay for co-pays associated with MRI and specialist appointments. CCM LCSW informed spouse of [employer] Information Program to assist family with applying for Medicare Part B. Spouse agreed to make contact. See Care Plan below for interventions and patient self-care actives.  Recent life changes Michael Zuniga: Financial Strain  Recommendation: Patient may benefit from, and is in agreement to work with LCSW to address care coordination needs and will continue to work with the clinical team to address health care and disease management related needs.   Follow up Plan: Patient would like continued follow-up from CCM LCSW .  Follow up scheduled in 01/26/21. Patient will call office if needed prior to next encounter.  SDOH (Social Determinants of Health) assessments and  interventions performed:    Advanced Directives Status: Not addressed in this encounter.  Care Plan  No Known Allergies  Outpatient Encounter Medications as of 01/16/2021  Medication Sig   amLODipine (NORVASC) 10 MG tablet Take 1 tablet (10 mg total) by mouth daily.   aspirin EC 81 MG tablet Take 81 mg by mouth daily.   atorvastatin (LIPITOR) 40 MG tablet Take 1 tablet (40 mg total) by mouth daily.   donepezil (ARICEPT) 5 MG tablet Take 1 tablet (5 mg total) by mouth at bedtime.   losartan (COZAAR) 50 MG tablet Take 1 tablet (50 mg total) by mouth daily.   omeprazole (PRILOSEC) 40 MG capsule Take 1 capsule (40 mg total) by mouth 2 (two) times daily before a meal.   propranolol (INDERAL) 20 MG tablet Take 1 tablet (20 mg total) by mouth daily.   sildenafil (REVATIO) 20 MG tablet TAKE 1-5 TABLETS AS NEEDED   No facility-administered encounter medications on file as of 01/16/2021.    Patient Active Problem List   Diagnosis Date Noted   B12 deficiency 12/29/2020   Elevated LFTs 12/29/2020   Memory changes 12/01/2020   Does not have health insurance 11/03/2020   Tremor of both hands 11/03/2020   Esophageal dysphagia    Gastroesophageal reflux disease with esophagitis without hemorrhage    Schatzki's ring of distal esophagus    Raynaud phenomenon 04/21/2018   Carpal tunnel syndrome 04/21/2018   ED (erectile dysfunction) 12/01/2016   Hypertension 02/16/2016   Hyperlipidemia 02/16/2016   CAD (coronary artery disease) 02/16/2016    Conditions to be addressed/monitored:  Stress ; Financial constraints related to medical coverage/bills  Care Plan : General Social Work (Adult)  Updates made by Bobby Rumpf,  Andree Golphin D, LCSW since 01/20/2021 12:00 AM     Problem: Quality of Life (General Plan of Care)      Goal: Quality of Life Maintained   Start Date: 01/16/2021  This Visit's Progress: On track  Priority: High  Note:   Current barriers:   Financial constraints related to affording  medical bills and Memory Deficits Needs Support, Education, and Care Coordination in order to meet unmet mental health needs. Clinical Goal(s): Over the next 120 days, patient will work with SW, counselor and therapist to reduce or manage symptoms of agitation, mood instability, stress, and bipolar until connected for ongoing counseling. Clinical Interventions:  Assessed patient's previous and current treatment, coping skills, support system and barriers to care  Patient interviewed and appropriate assessments performed Patient's spouse shared that patient needs additional insurance to cover costs associated with MRI and specialists. They have applied for Part B Medicare to [employer]; however, has not heard back from them Family reports having a fixed income (social security) and are unable to pay for medical bills. Patient is ineligible for [employer] Assistance Program CCM LCSW informed spouse of [place]) and how they provide free services to assist with applying/modifying Medicare insurance. Contact information was provided and spouse agreed to call [facility] next week to inquire about Part B Medicare CCM LCSW reviewed upcoming appointments for patient Spouse denied psychosocial stressors Review various resources, discussed options and provided patient information about  [facility] Solution-Focused Strategies, Active listening / Reflection utilized , Emotional Supportive Provided, Psychoeducation for mental health needs , Caregiver stress acknowledged , and Verbalization of feelings encouraged  ; Discussed plans with patient for ongoing care management follow up and provided patient with direct contact information for care management team Collaboration with PCP regarding development and update of comprehensive plan of care as evidenced by provider attestation and co-signature Inter-disciplinary care team collaboration (see longitudinal plan of  care) Patient Goals/Self-Care Activities: Over the next 120 days Attend scheduled appointments Contact office with any questions or concerns Contact [facility] to inquire about Medicare Part B        Christa See, MSW, [email or web address] Phone [phone] 11:21 AM

## 2021-01-26 ENCOUNTER — Telehealth: Payer: Self-pay

## 2021-01-28 ENCOUNTER — Telehealth: Payer: Self-pay | Admitting: Licensed Clinical Social Worker

## 2021-01-28 NOTE — Telephone Encounter (Signed)
    Clinical Social Work  Chronic Care Management   Phone Outreach    01/28/2021 Name: JAKWAN MINDEL MRN: [id number] DOB: 06-02-1951  Madie Reno Spagnuolo is a 70 y.o. year old male who is a primary care patient of Cannady, Barbaraann Faster, NP .   F/U phone call today to assess needs, progress and barriers with care plan goals.   Telephone outreach was unsuccessful A HIPPA compliant phone message was left for the patient providing contact information and requesting a return call.   Plan:CCM LCSW will wait for return call. If no return call is received, Will reach out to patient again in the next 30 days .   Review of patient status, including review of consultants reports, relevant laboratory and other test results, and collaboration with appropriate care team members and the patient's provider was performed as part of comprehensive patient evaluation and provision of care management services.    Christa See, MSW, [email or web address] Phone [phone] 10:16 AM

## 2021-02-10 ENCOUNTER — Ambulatory Visit (INDEPENDENT_AMBULATORY_CARE_PROVIDER_SITE_OTHER): Payer: Medicare Other | Admitting: Nurse Practitioner

## 2021-02-10 ENCOUNTER — Encounter: Payer: Self-pay | Admitting: Nurse Practitioner

## 2021-02-10 ENCOUNTER — Other Ambulatory Visit: Payer: Self-pay

## 2021-02-10 VITALS — BP 130/76 | HR 62 | Temp 98.0°F | Wt 160.2 lb

## 2021-02-10 DIAGNOSIS — E538 Deficiency of other specified B group vitamins: Secondary | ICD-10-CM

## 2021-02-10 DIAGNOSIS — R251 Tremor, unspecified: Secondary | ICD-10-CM

## 2021-02-10 DIAGNOSIS — R413 Other amnesia: Secondary | ICD-10-CM | POA: Diagnosis not present

## 2021-02-10 DIAGNOSIS — I2510 Atherosclerotic heart disease of native coronary artery without angina pectoris: Secondary | ICD-10-CM

## 2021-02-10 NOTE — Patient Instructions (Signed)

## 2021-02-10 NOTE — Progress Notes (Addendum)
BP 130/76   Pulse 62   Temp 98 F (36.7 C) (Oral)   Wt 160 lb 3.2 oz (72.7 kg)   SpO2 97%   BMI 24.06 kg/m    Subjective:    Patient ID: Michael Zuniga, male    DOB: Nov 23, 1950, 70 y.o.   MRN: [id number]  HPI: Michael Zuniga is a 70 y.o. male  Chief Complaint  Patient presents with   Memory Changes    Patient is here to follow up on Memory Changes. Patient denies having any problems or concerns.    TREMOR & MEMORY CHANGES Follow-up for memory changes -- ordered MRI and neurology referral but they are waiting on insurance coverage to schedule -- CCM and community care referrals in as is self pay.  Reports ongoing issues for several years intermittently with tremor.  Started trial of Propranolol 20 MG last visit, 11/03/20.  Improvement in tremor at this time with medication on board.  Denies ever heavy drinking.  Not currently drinking any alcohol.  Wife at bedside with him today and reports she has noticed he continues struggling with memory, which has gotten worse with number recall.  Added on Aricept in May 2022 and is tolerating.  Had Covid around October 2021.  Reports noticing it about one year before Covid, but became worse after Covid.  Difficulty remembering tasks, including items in fridge.  She reports noticing changes in his routine.  Wife reports normal gait at home (walks fast), no recent falls, no slurred speech, weakness, headaches, or change in facial symmetry noted.    His B12 level recent visit was 334, supplement started. Aspirin: yes Recurrent headaches: no Visual changes: no Palpitations: no Dyspnea: no Chest pain: no Lower extremity edema: no Dizzy/lightheaded: no  6CIT Screen 12/30/2020 12/01/2020 04/16/2020 03/06/2019  What Year? 0 points 4 points 0 points 0 points  What month? 0 points 3 points 0 points 0 points  What time? 3 points 3 points 0 points 0 points  Count back from 20 4 points 4 points 0 points 0 points  Months in reverse 4 points 4 points 0 points 0  points  Repeat phrase 10 points 10 points 0 points 0 points  Total Score 21 28 0 0    Relevant past medical, surgical, family and social history reviewed and updated as indicated. Interim medical history since our last visit reviewed. Allergies and medications reviewed and updated.  Review of Systems  Constitutional:  Negative for activity change, diaphoresis, fatigue and fever.  Respiratory:  Negative for cough, chest tightness, shortness of breath and wheezing.   Cardiovascular:  Negative for chest pain, palpitations and leg swelling.  Gastrointestinal: Negative.   Neurological:  Positive for tremors. Negative for dizziness, seizures, facial asymmetry, speech difficulty, weakness and headaches.  Psychiatric/Behavioral:  Positive for confusion (memory changes). Negative for self-injury, sleep disturbance and suicidal ideas. The patient is not nervous/anxious.    Per HPI unless specifically indicated above     Objective:    BP 130/76   Pulse 62   Temp 98 F (36.7 C) (Oral)   Wt 160 lb 3.2 oz (72.7 kg)   SpO2 97%   BMI 24.06 kg/m   Wt Readings from Last 3 Encounters:  02/10/21 160 lb 3.2 oz (72.7 kg)  12/30/20 163 lb 12.8 oz (74.3 kg)  12/01/20 173 lb 4 oz (78.6 kg)    Physical Exam Vitals and nursing note reviewed.  Constitutional:      General: He is awake.  He is not in acute distress.    Appearance: He is well-developed and well-groomed. He is not ill-appearing.  HENT:     Head: Normocephalic and atraumatic.     Right Ear: Hearing normal. No drainage.     Left Ear: Hearing normal. No drainage.  Eyes:     General: Lids are normal.        Right eye: No discharge.        Left eye: No discharge.     Conjunctiva/sclera: Conjunctivae normal.     Pupils: Pupils are equal, round, and reactive to light.  Neck:     Vascular: No carotid bruit.  Cardiovascular:     Rate and Rhythm: Normal rate and regular rhythm.     Heart sounds: Normal heart sounds, S1 normal and S2  normal. No murmur heard.   No gallop.  Pulmonary:     Effort: Pulmonary effort is normal. No accessory muscle usage or respiratory distress.     Breath sounds: Normal breath sounds.  Abdominal:     General: Bowel sounds are normal.     Palpations: Abdomen is soft.  Musculoskeletal:        General: Normal range of motion.     Cervical back: Normal range of motion and neck supple.     Right lower leg: No edema.     Left lower leg: No edema.  Skin:    General: Skin is warm and dry.  Neurological:     Mental Status: He is alert.     Cranial Nerves: Cranial nerves are intact.     Motor: No weakness, tremor (none noted today, stable) or atrophy.     Coordination: Romberg sign negative. Finger-Nose-Finger Test and Heel to [place] Test normal.     Gait: Gait is intact.     Deep Tendon Reflexes: Reflexes are normal and symmetric.     Reflex Scores:      Brachioradialis reflexes are 2+ on the right side and 2+ on the left side.      Patellar reflexes are 2+ on the right side.    Comments: Normal gait, no shuffle or shortened gait.  No tremor noted today, active or resting.  No rigidity on exam.  No flat affect.  Not oriented to year, month, or day -- aware he is at provider office.  Psychiatric:        Attention and Perception: Attention normal.        Mood and Affect: Mood normal.        Speech: Speech normal.        Behavior: Behavior normal. Behavior is cooperative.    Results for orders placed or performed in visit on 12/01/20  CBC with Differential/Platelet  Result Value Ref Range   WBC 5.0 3.4 - 10.8 x10E3/uL   RBC 4.39 4.14 - 5.80 x10E6/uL   Hemoglobin 14.2 13.0 - 17.7 g/dL   Hematocrit 41.8 37.5 - 51.0 %   MCV 95 79 - 97 fL   MCH 32.3 26.6 - 33.0 pg   MCHC 34.0 31.5 - 35.7 g/dL   RDW 13.9 11.6 - 15.4 %   Platelets 272 150 - 450 x10E3/uL   Neutrophils 63 Not Estab. %   Lymphs 22 Not Estab. %   Monocytes 11 Not Estab. %   Eos 2 Not Estab. %   Basos 1 Not Estab. %    Neutrophils Absolute 3.2 1.4 - 7.0 x10E3/uL   Lymphocytes Absolute 1.1 0.7 - 3.1 x10E3/uL  Monocytes Absolute 0.5 0.1 - 0.9 x10E3/uL   EOS (ABSOLUTE) 0.1 0.0 - 0.4 x10E3/uL   Basophils Absolute 0.1 0.0 - 0.2 x10E3/uL   Immature Granulocytes 1 Not Estab. %   Immature Grans (Abs) 0.0 0.0 - 0.1 x10E3/uL  Comprehensive metabolic panel  Result Value Ref Range   Glucose 100 (H) 65 - 99 mg/dL   BUN 15 8 - 27 mg/dL   Creatinine, Ser 1.26 0.76 - 1.27 mg/dL   eGFR 61 >59 mL/min/1.73   BUN/Creatinine Ratio 12 10 - 24   Sodium 141 134 - 144 mmol/L   Potassium 4.2 3.5 - 5.2 mmol/L   Chloride 101 96 - 106 mmol/L   CO2 21 20 - 29 mmol/L   Calcium 10.0 8.6 - 10.2 mg/dL   Total Protein 7.5 6.0 - 8.5 g/dL   Albumin 4.8 (H) 3.7 - 4.7 g/dL   Globulin, Total 2.7 1.5 - 4.5 g/dL   Albumin/Globulin Ratio 1.8 1.2 - 2.2   Bilirubin Total 0.5 0.0 - 1.2 mg/dL   Alkaline Phosphatase 95 44 - 121 IU/L   AST 52 (H) 0 - 40 IU/L   ALT 57 (H) 0 - 44 IU/L  Lipid Panel w/o Chol/HDL Ratio  Result Value Ref Range   Cholesterol, Total 192 100 - 199 mg/dL   Triglycerides 109 0 - 149 mg/dL   HDL 70 >39 mg/dL   VLDL Cholesterol Cal 19 5 - 40 mg/dL   LDL Chol Calc (NIH) 103 (H) 0 - 99 mg/dL  TSH  Result Value Ref Range   TSH 1.540 0.450 - 4.500 uIU/mL  Vitamin B12  Result Value Ref Range   Vitamin B-12 334 232 - 1,245 pg/mL  RPR  Result Value Ref Range   RPR Ser Ql Non Reactive Non Reactive      Assessment & Plan:   Problem List Items Addressed This Visit       Other   Tremor of both hands    Ongoing for several years -- ?PD vs essential, no rigidity, gait disturbance, or flat affect.  Has improved with Propranolol, suspect more essential tremor.  Will work on obtaining MRI and neurology visit, as ordered last visit, but cost an issue -- now has Part B and will work on obtaining these.  Return in 6 weeks.      Memory changes - Primary    Over past year per wife report with worsening after Covid in  October 2021.  6CIT = 28 last visit and 21, poor recall and not able to state month or year.  Recent labs stable with exception of mild low B12, started supplement -- will recheck level today.  Referral to neurology placed last visit and MRI ordered, but has not obtained or scheduled due to cost concerns -- now has Part B and will work on scheduling with referral coordinator.  Will continue Aricept at 5 MG and adjust accordingly.  Return in 6 weeks.      B12 deficiency    Noted on recent labs, wife has started him on supplement.  Continue this and recheck level today.      Relevant Orders   Vitamin B12     Follow up plan: Return in about 6 weeks (around 03/24/2021) for Memory changes.

## 2021-02-10 NOTE — Assessment & Plan Note (Signed)
Noted on recent labs, wife has started him on supplement.  Continue this and recheck level today.

## 2021-02-10 NOTE — Assessment & Plan Note (Signed)
Over past year per wife report with worsening after Covid in October 2021.  6CIT = 28 last visit and 21, poor recall and not able to state month or year.  Recent labs stable with exception of mild low B12, started supplement -- will recheck level today.  Referral to neurology placed last visit and MRI ordered, but has not obtained or scheduled due to cost concerns -- now has Part B and will work on scheduling with referral coordinator.  Will continue Aricept at 5 MG and adjust accordingly.  Return in 6 weeks.

## 2021-02-10 NOTE — Assessment & Plan Note (Signed)
Ongoing for several years -- ?PD vs essential, no rigidity, gait disturbance, or flat affect.  Has improved with Propranolol, suspect more essential tremor.  Will work on obtaining MRI and neurology visit, as ordered last visit, but cost an issue -- now has Part B and will work on obtaining these.  Return in 6 weeks.

## 2021-02-12 NOTE — Addendum Note (Signed)
Addended by: Marnee Guarneri T on: 02/12/2021 05:13 PM   Modules accepted: Orders

## 2021-02-13 ENCOUNTER — Ambulatory Visit (INDEPENDENT_AMBULATORY_CARE_PROVIDER_SITE_OTHER): Payer: Medicare Other | Admitting: General Practice

## 2021-02-13 ENCOUNTER — Telehealth: Payer: Self-pay | Admitting: General Practice

## 2021-02-13 ENCOUNTER — Telehealth: Payer: Medicare Other | Admitting: General Practice

## 2021-02-13 DIAGNOSIS — I2510 Atherosclerotic heart disease of native coronary artery without angina pectoris: Secondary | ICD-10-CM

## 2021-02-13 DIAGNOSIS — E782 Mixed hyperlipidemia: Secondary | ICD-10-CM

## 2021-02-13 DIAGNOSIS — I10 Essential (primary) hypertension: Secondary | ICD-10-CM

## 2021-02-13 DIAGNOSIS — R413 Other amnesia: Secondary | ICD-10-CM

## 2021-02-13 DIAGNOSIS — E7800 Pure hypercholesterolemia, unspecified: Secondary | ICD-10-CM | POA: Diagnosis not present

## 2021-02-13 NOTE — Patient Instructions (Signed)
Visit Information   PATIENT GOALS:   Goals Addressed             This Visit's Progress    RNCM: Enhance My Mental Skills       Timeframe:  Long-Range Goal Priority:  High Start Date:      01-09-2021                       Expected End Date:       01-09-2022                Follow Up Date 04-03-2021    - check out a senior citizen activity program - do word search or crossword puzzles daily - learn a new hobby like knitting or woodworking - read 1 new book each month - stay in touch with my family and friends - take a walk daily and think about what I am seeing - volunteer once a week in my community    Why is this important?   As we age, or sometimes because we have an illness, it feels like our memory and ability to figure things out is not very good.  There are things you can do to keep your memory and your thinking as strong as possible.    Notes: The patient with memory changes. 02-13-2021: The patient is active, does not work. Wife states sometimes he sleeps well sometimes he does not. Has full Medicare benefits now and will have an MRI on 02-17-2021 for evaluation of memory changes. Denies any issues with tremors at this time. Will continue to monitor.      RNCM: Manage My Emotions       Timeframe:  Short-Term Goal Priority:  High Start Date:     01-09-2021                        Expected End Date:      06-26-2021                 Follow Up Date 04/03/2021    - call and visit an old friend - check out volunteer opportunities - join a support group - laugh; watch a funny movie or comedian - learn and use visualization or guided imagery - perform a random act of kindness - practice relaxation or meditation daily - start or continue a personal journal - talk about feelings with a friend, family or spiritual advisor - practice positive thinking and self-talk    Why is this important?   When you are stressed, down or upset, your body reacts too.  For example, your blood  pressure may get higher; you may have a headache or stomachache.  When your emotions get the best of you, your body's ability to fight off cold and flu gets weak.  These steps will help you manage your emotions.     Notes: 01-09-2021: The patient no longer can work. The patient's wife states he is doing okay but has noticed memory changes. 02-13-2021: The patients wife states he is about the same. He is able to do his basic care and needs. Cannot remember dates. Will have an MRI on 02-17-2021 for evaluation.         CLINICAL CARE PLAN: Patient Care Plan: RNCM: General Plan of care for chronic Disease ManagementL: HTN, HLD, CAD, and Memory Loss     Problem Identified: RNCM: Management of HTN, HLD, CAD, and Memory Changes   Priority:  High  Onset Date: 01/09/2021     Long-Range Goal: RNCM: Management of HTN, HLD, CAD, and Memory Loss   Start Date: 01/09/2021  Expected End Date: 01/09/2022  Recent Progress: On track  Priority: High  Note:   Current Barriers:  Knowledge Deficits related to plan of care for management of CAD, HTN, HLD, and memory loss  Care Coordination needs related to Financial constraints related to no insurance with medicare part B, Level of care concerns, Literacy concerns, Memory Deficits, and no insurance with medicare part B- filed paperwork but have not heard back from paperwork, do not know resources in a patient with CAD, HTN, HLD, and memory loss Chronic Disease Management support and education needs related to CAD, HTN, HLD, and memory loss Film/video editor.  Cognitive Deficits Does not have Medicare part B insurance, spouse says she has filled out paperwork but has not heard anything. Does not know who to call. Will collaborate with LCSW for assistance. 02-13-2021: Now has Medicare Part B.   RNCM Clinical Goal(s):  Patient will verbalize understanding of plan for management of CAD, HTN, HLD, and memory loss work with RN Case Freight forwarder and Licensed Clinical  Social Worker to address needs related to CAD, HTN, HLD, and memory loss and Financial constraints related to the need for medicare part B, Level of care concerns, and Memory Deficits take all medications exactly as prescribed and will call provider for medication related questions attend all scheduled medical appointments: 03-25-2021 at 3 pm demonstrate a decrease in CAD, HTN, HLD, and memory loss exacerbations demonstrate improved adherence to prescribed treatment plan for CAD, HTN, HLD, and memory loss demonstrate improved health management independence verbalize basic understanding of CAD, HTN, HLD, and memory loss disease process and self health management plan demonstrate understanding of rationale for each prescribed medication.  work with CM clinical Education officer, museum to assist with questions about follow up with Medicare part B and other resources to help with meeting needs of the patient. 02-13-2021: Now has Medicare part B demonstrate ongoing self health care management ability through collaboration with RN Care manager, provider, and care team.   Interventions: 1:1 collaboration with Marnee Guarneri, NP regarding development and update of comprehensive plan of care as evidenced by provider attestation and co-signature Inter-disciplinary care team collaboration (see longitudinal plan of care)   CAD  (Status: Goal on track: YES.) Assessed understanding of CAD diagnosis Medications reviewed including medications utilized in CAD treatment plan Provided education on importance of blood pressure control in management of CAD;  Hyperlipidemia:  (Status: Goal on track: YES.) Medication review performed; medication list updated in electronic medical record.  Provider established cholesterol goals reviewed; Counseled on importance of regular laboratory monitoring as prescribed; Provided HLD educational materials; Reviewed role and benefits of statin for ASCVD risk reduction; Discussed strategies  to manage statin-induced myalgias; Reviewed importance of limiting foods high in cholesterol; Lab Results  Component Value Date   CHOL 192 12/01/2020   CHOL 184 04/16/2020   CHOL 152 09/03/2019   Lab Results  Component Value Date   HDL 70 12/01/2020   HDL 102 04/16/2020   HDL 87 09/03/2019   Lab Results  Component Value Date   LDLCALC 103 (H) 12/01/2020   LDLCALC 72 04/16/2020   LDLCALC 55 09/03/2019   Lab Results  Component Value Date   TRIG 109 12/01/2020   TRIG 49 04/16/2020   TRIG 44 09/03/2019  No results found for: CHOLHDL No results found for: LDLDIRECT   Hypertension: (Status:  Goal on track: YES.) Last practice recorded BP readings:  BP Readings from Last 3 Encounters:  02/10/21 130/76  12/30/20 121/76  12/01/20 130/78  Most recent eGFR/CrCl:  Lab Results  Component Value Date   EGFR 61 12/01/2020    No components found for: CRCL  Evaluation of current treatment plan related to hypertension self management and patient's adherence to plan as established by provider.  02-13-2021: The patient has good control of blood pressures at this time. The patient is compliant with medications.  Provided education to patient re: stroke prevention, s/s of heart attack and stroke; Reviewed medications with patient and discussed importance of compliance; Counseled on the importance of exercise goals with target of 150 minutes per week Discussed plans with patient for ongoing care management follow up and provided patient with direct contact information for care management team; Advised patient, providing education and rationale, to monitor blood pressure daily and record, calling PCP for findings outside established parameters;  Reviewed scheduled/upcoming provider appointments including: 03-25-2021 at 3 pm Provided education on prescribed diet heart healthy diet;  Discussed complications of poorly controlled blood pressure such as heart disease, stroke, circulatory  complications, vision complications, kidney impairment, sexual dysfunction;   Memory loss   (Status: Goal on track: YES.) Evaluation of current treatment plan related to  Memory loss , Memory Deficits self-management and patient's adherence to plan as established by provider.02-13-2021: The patients wife states things are about the same. States that he is on full Medicare benefits now and is going to have an MRI on 02-17-2021.   Discussed plans with patient for ongoing care management follow up and provided patient with direct contact information for care management team Evaluation of current treatment plan related to memory loss and changes  and patient's adherence to plan as established by provider; Advised patient to call the office for changes in memory or new concerns; Provided education to patient re: ways to enhance memory ; Reviewed medications with patient and discussed compliance ; Collaborated with LCSW and pcp  regarding memory changes, other chronic conditions and working with the CCM team to assist with needs of the patient, they have about 3 months of money to pay for housing etc but are concerned about not having medicare part B and will not be able to have a MRI until they know they will not have to pay for it. 02-13-2021: The patients wife says they are on full Medicare now and he will be having a MRI on 02-17-2021; Reviewed scheduled/upcoming provider appointments including: 03-25-2021 at 3 pm; Social Work referral for assistance with Medicare B, ongoing support and education; Discussed plans with patient for ongoing care management follow up and provided patient with direct contact information for care management team; Screening for signs and symptoms of depression related to chronic disease state;  Assessed social determinant of health barriers;   Patient Goals/Self-Care Activities: Patient will self administer medications as prescribed Patient will attend all scheduled provider  appointments Patient will call pharmacy for medication refills Patient will attend church or other social activities Patient will continue to perform ADL's independently Patient will continue to perform IADL's independently Patient will call provider office for new concerns or questions Patient will work with BSW to address care coordination needs and will continue to work with the clinical team to address health care and disease management related needs.    Follow Up Plan: Telephone follow up appointment with care management team member scheduled for: 04-03-2021 at 1145 am  Patient Care Plan: General Social Work (Adult)     Problem Identified: Quality of Life (General Plan of Care)      Goal: Quality of Life Maintained   Start Date: 01/16/2021  This Visit's Progress: On track  Priority: High  Note:   Current barriers:   Financial constraints related to affording medical bills and Memory Deficits Needs Support, Education, and Care Coordination in order to meet unmet mental health needs. Clinical Goal(s): Over the next 120 days, patient will work with SW, counselor and therapist to reduce or manage symptoms of agitation, mood instability, stress, and bipolar until connected for ongoing counseling. Clinical Interventions:  Assessed patient's previous and current treatment, coping skills, support system and barriers to care  Patient interviewed and appropriate assessments performed Patient's spouse shared that patient needs additional insurance to cover costs associated with MRI and specialists. They have applied for Part B Medicare to [employer]; however, has not heard back from them Family reports having a fixed income (social security) and are unable to pay for medical bills. Patient is ineligible for [employer] Assistance Program CCM LCSW informed spouse of [place]) and how they provide free services to assist with  applying/modifying Medicare insurance. Contact information was provided and spouse agreed to call [facility] next week to inquire about Part B Medicare CCM LCSW reviewed upcoming appointments for patient Spouse denied psychosocial stressors Review various resources, discussed options and provided patient information about  [facility] Solution-Focused Strategies, Active listening / Reflection utilized , Emotional Supportive Provided, Psychoeducation for mental health needs , Caregiver stress acknowledged , and Verbalization of feelings encouraged  ; Discussed plans with patient for ongoing care management follow up and provided patient with direct contact information for care management team Collaboration with PCP regarding development and update of comprehensive plan of care as evidenced by provider attestation and co-signature Inter-disciplinary care team collaboration (see longitudinal plan of care) Patient Goals/Self-Care Activities: Over the next 120 days Attend scheduled appointments Contact office with any questions or concerns Contact [facility] to inquire about Medicare Part B     Consent to CCM Services: Mr. Towry was given information about Chronic Care Management services including:  CCM service includes personalized support from designated clinical staff supervised by his physician, including individualized plan of care and coordination with other care providers 24/7 contact phone numbers for assistance for urgent and routine care needs. Service will only be billed when office clinical staff spend 20 minutes or more in a month to coordinate care. Only one practitioner may furnish and bill the service in a calendar month. The patient may stop CCM services at any time (effective at the end of the month) by phone call to the office staff. The patient will be responsible for cost sharing (co-pay) of up to 20% of the service fee (after annual deductible is met).  Patient agreed to services and  verbal consent obtained.   The patient verbalized understanding of instructions, educational materials, and care plan provided today and declined offer to receive copy of patient instructions, educational materials, and care plan.   Telephone follow up appointment with care management team member scheduled for:04-03-2021 at 1145 am  [place], MSN, [facility] Mobile: [phone]

## 2021-02-13 NOTE — Chronic Care Management (AMB) (Signed)
Chronic Care Management   CCM RN Visit Note  02/13/2021 Name: Michael Zuniga MRN: [id number] DOB: 08/17/1950  Subjective: Michael Zuniga is a 70 y.o. year old male who is a primary care patient of Cannady, Barbaraann Faster, NP. The care management team was consulted for assistance with disease management and care coordination needs.    Engaged with patient by telephone for follow up visit in response to provider referral for case management and/or care coordination services.   Consent to Services:  The patient was given the following information about Chronic Care Management services today, agreed to services, and gave verbal consent: 1. CCM service includes personalized support from designated clinical staff supervised by the primary care provider, including individualized plan of care and coordination with other care providers 2. 24/7 contact phone numbers for assistance for urgent and routine care needs. 3. Service will only be billed when office clinical staff spend 20 minutes or more in a month to coordinate care. 4. Only one practitioner may furnish and bill the service in a calendar month. 5.The patient may stop CCM services at any time (effective at the end of the month) by phone call to the office staff. 6. The patient will be responsible for cost sharing (co-pay) of up to 20% of the service fee (after annual deductible is met). Patient agreed to services and consent obtained.  Patient agreed to services and verbal consent obtained.   Assessment: Review of patient past medical history, allergies, medications, health status, including review of consultants reports, laboratory and other test data, was performed as part of comprehensive evaluation and provision of chronic care management services.   SDOH (Social Determinants of Health) assessments and interventions performed:    CCM Care Plan  No Known Allergies  Outpatient Encounter Medications as of 02/13/2021  Medication Sig   amLODipine  (NORVASC) 10 MG tablet Take 1 tablet (10 mg total) by mouth daily.   aspirin EC 81 MG tablet Take 81 mg by mouth daily.   atorvastatin (LIPITOR) 40 MG tablet Take 1 tablet (40 mg total) by mouth daily.   donepezil (ARICEPT) 5 MG tablet Take 1 tablet (5 mg total) by mouth at bedtime.   losartan (COZAAR) 50 MG tablet Take 1 tablet (50 mg total) by mouth daily.   omeprazole (PRILOSEC) 40 MG capsule Take 1 capsule (40 mg total) by mouth 2 (two) times daily before a meal.   propranolol (INDERAL) 20 MG tablet Take 1 tablet (20 mg total) by mouth daily.   sildenafil (REVATIO) 20 MG tablet TAKE 1-5 TABLETS AS NEEDED   vitamin B-12 (CYANOCOBALAMIN) 1000 MCG tablet Take 1,000 mcg by mouth daily.   No facility-administered encounter medications on file as of 02/13/2021.    Patient Active Problem List   Diagnosis Date Noted   B12 deficiency 12/29/2020   Elevated LFTs 12/29/2020   Memory changes 12/01/2020   Does not have health insurance 11/03/2020   Tremor of both hands 11/03/2020   Esophageal dysphagia    Gastroesophageal reflux disease with esophagitis without hemorrhage    Schatzki's ring of distal esophagus    Raynaud phenomenon 04/21/2018   Carpal tunnel syndrome 04/21/2018   ED (erectile dysfunction) 12/01/2016   Hypertension 02/16/2016   Hyperlipidemia 02/16/2016   CAD (coronary artery disease) 02/16/2016    Conditions to be addressed/monitored:CAD, HTN, HLD, and Memory Loss  Care Plan : RNCM: General Plan of care for chronic Disease ManagementL: HTN, HLD, CAD, and Memory Loss  Updates made by Dellie Catholic  J since 02/13/2021 12:00 AM     Problem: RNCM: Management of HTN, HLD, CAD, and Memory Changes   Priority: High  Onset Date: 01/09/2021     Long-Range Goal: RNCM: Management of HTN, HLD, CAD, and Memory Loss   Start Date: 01/09/2021  Expected End Date: 01/09/2022  Recent Progress: On track  Priority: High  Note:   Current Barriers:  Knowledge Deficits related to plan of care  for management of CAD, HTN, HLD, and memory loss  Care Coordination needs related to Financial constraints related to no insurance with medicare part B, Level of care concerns, Literacy concerns, Memory Deficits, and no insurance with medicare part B- filed paperwork but have not heard back from paperwork, do not know resources in a patient with CAD, HTN, HLD, and memory loss Chronic Disease Management support and education needs related to CAD, HTN, HLD, and memory loss Film/video editor.  Cognitive Deficits Does not have Medicare part B insurance, spouse says she has filled out paperwork but has not heard anything. Does not know who to call. Will collaborate with LCSW for assistance. 02-13-2021: Now has Medicare Part B.   RNCM Clinical Goal(s):  Patient will verbalize understanding of plan for management of CAD, HTN, HLD, and memory loss work with RN Case Freight forwarder and Licensed Clinical Social Worker to address needs related to CAD, HTN, HLD, and memory loss and Financial constraints related to the need for medicare part B, Level of care concerns, and Memory Deficits take all medications exactly as prescribed and will call provider for medication related questions attend all scheduled medical appointments: 03-25-2021 at 3 pm demonstrate a decrease in CAD, HTN, HLD, and memory loss exacerbations demonstrate improved adherence to prescribed treatment plan for CAD, HTN, HLD, and memory loss demonstrate improved health management independence verbalize basic understanding of CAD, HTN, HLD, and memory loss disease process and self health management plan demonstrate understanding of rationale for each prescribed medication.  work with CM clinical Education officer, museum to assist with questions about follow up with Medicare part B and other resources to help with meeting needs of the patient. 02-13-2021: Now has Medicare part B demonstrate ongoing self health care management ability through collaboration with RN  Care manager, provider, and care team.   Interventions: 1:1 collaboration with Marnee Guarneri, NP regarding development and update of comprehensive plan of care as evidenced by provider attestation and co-signature Inter-disciplinary care team collaboration (see longitudinal plan of care)   CAD  (Status: Goal on track: YES.) Assessed understanding of CAD diagnosis Medications reviewed including medications utilized in CAD treatment plan Provided education on importance of blood pressure control in management of CAD;  Hyperlipidemia:  (Status: Goal on track: YES.) Medication review performed; medication list updated in electronic medical record.  Provider established cholesterol goals reviewed; Counseled on importance of regular laboratory monitoring as prescribed; Provided HLD educational materials; Reviewed role and benefits of statin for ASCVD risk reduction; Discussed strategies to manage statin-induced myalgias; Reviewed importance of limiting foods high in cholesterol; Lab Results  Component Value Date   CHOL 192 12/01/2020   CHOL 184 04/16/2020   CHOL 152 09/03/2019   Lab Results  Component Value Date   HDL 70 12/01/2020   HDL 102 04/16/2020   HDL 87 09/03/2019   Lab Results  Component Value Date   LDLCALC 103 (H) 12/01/2020   LDLCALC 72 04/16/2020   LDLCALC 55 09/03/2019   Lab Results  Component Value Date   TRIG 109 12/01/2020   TRIG  49 04/16/2020   TRIG 44 09/03/2019  No results found for: CHOLHDL No results found for: LDLDIRECT   Hypertension: (Status: Goal on track: YES.) Last practice recorded BP readings:  BP Readings from Last 3 Encounters:  02/10/21 130/76  12/30/20 121/76  12/01/20 130/78  Most recent eGFR/CrCl:  Lab Results  Component Value Date   EGFR 61 12/01/2020    No components found for: CRCL  Evaluation of current treatment plan related to hypertension self management and patient's adherence to plan as established by provider.   02-13-2021: The patient has good control of blood pressures at this time. The patient is compliant with medications.  Provided education to patient re: stroke prevention, s/s of heart attack and stroke; Reviewed medications with patient and discussed importance of compliance; Counseled on the importance of exercise goals with target of 150 minutes per week Discussed plans with patient for ongoing care management follow up and provided patient with direct contact information for care management team; Advised patient, providing education and rationale, to monitor blood pressure daily and record, calling PCP for findings outside established parameters;  Reviewed scheduled/upcoming provider appointments including: 03-25-2021 at 3 pm Provided education on prescribed diet heart healthy diet;  Discussed complications of poorly controlled blood pressure such as heart disease, stroke, circulatory complications, vision complications, kidney impairment, sexual dysfunction;   Memory loss   (Status: Goal on track: YES.) Evaluation of current treatment plan related to  Memory loss , Memory Deficits self-management and patient's adherence to plan as established by provider.02-13-2021: The patients wife states things are about the same. States that he is on full Medicare benefits now and is going to have an MRI on 02-17-2021.   Discussed plans with patient for ongoing care management follow up and provided patient with direct contact information for care management team Evaluation of current treatment plan related to memory loss and changes  and patient's adherence to plan as established by provider; Advised patient to call the office for changes in memory or new concerns; Provided education to patient re: ways to enhance memory ; Reviewed medications with patient and discussed compliance ; Collaborated with LCSW and pcp  regarding memory changes, other chronic conditions and working with the CCM team to assist with  needs of the patient, they have about 3 months of money to pay for housing etc but are concerned about not having medicare part B and will not be able to have a MRI until they know they will not have to pay for it. 02-13-2021: The patients wife says they are on full Medicare now and he will be having a MRI on 02-17-2021; Reviewed scheduled/upcoming provider appointments including: 03-25-2021 at 3 pm; Social Work referral for assistance with Medicare B, ongoing support and education; Discussed plans with patient for ongoing care management follow up and provided patient with direct contact information for care management team; Screening for signs and symptoms of depression related to chronic disease state;  Assessed social determinant of health barriers;   Patient Goals/Self-Care Activities: Patient will self administer medications as prescribed Patient will attend all scheduled provider appointments Patient will call pharmacy for medication refills Patient will attend church or other social activities Patient will continue to perform ADL's independently Patient will continue to perform IADL's independently Patient will call provider office for new concerns or questions Patient will work with BSW to address care coordination needs and will continue to work with the clinical team to address health care and disease management related needs.  Follow Up Plan: Telephone follow up appointment with care management team member scheduled for: 04-03-2021 at 1145 am     Plan:Telephone follow up appointment with care management team member scheduled for:  04-03-2021 at 1145 am  [place], MSN, [facility] Mobile: [phone]

## 2021-02-13 NOTE — Telephone Encounter (Signed)
  Care Management   Follow Up Note   02/13/2021 Name: Michael Zuniga MRN: [id number] DOB: [birth year]   Referred by: Venita Lick, NP Reason for referral : Chronic Care Management (RNCM: Follow up for Chronic Disease Management and Care Coordination Needs)   The patients wife returned call to the [facility]. See new encounter for information.   Follow Up Plan: Telephone follow up appointment with care management team member scheduled for: 04-03-2021 at 1145 am  [place], MSN, [facility] Mobile: [phone]

## 2021-02-14 LAB — VITAMIN B12: Vitamin B-12: 1055 pg/mL (ref 232–1245)

## 2021-02-14 NOTE — Progress Notes (Signed)
Good morning, please let Mr. And Mrs. Kisamore know B12 level has returned and is improving.  Continue daily supplement.  Have a great day!! Keep being amazing!!  Thank you for allowing me to participate in your care.  I appreciate you. Kindest regards, Anasofia Micallef

## 2021-02-17 ENCOUNTER — Ambulatory Visit
Admission: RE | Admit: 2021-02-17 | Discharge: 2021-02-17 | Disposition: A | Discharge status: Home / Self Care | Payer: Medicare Other | Source: Ambulatory Visit | Attending: Nurse Practitioner | Admitting: Nurse Practitioner

## 2021-02-17 ENCOUNTER — Other Ambulatory Visit: Payer: Self-pay

## 2021-02-17 DIAGNOSIS — R413 Other amnesia: Secondary | ICD-10-CM | POA: Insufficient documentation

## 2021-02-17 MED ORDER — GADOBUTROL 1 MMOL/ML IV SOLN
7.5000 mL | Freq: Once | INTRAVENOUS | Status: AC | PRN
  Administered 2021-02-17: 7.5 mL via INTRAVENOUS

## 2021-02-18 NOTE — Progress Notes (Signed)
Can we see if patient has Medicare, last visit they reported he did and I placed new neurology referral which was declined because "patient does not have Medicare yet", this was on 02/12/21.  If Michael Zuniga will not take referral can we send to [facility] or [facility] please.  Thanks.

## 2021-02-18 NOTE — Progress Notes (Signed)
Attempted to call this afternoon and left general HIPAA compliant message to return call to office, if they call back please alert them: - Your imaging has returned.  There is noted to be some atrophy, meaning that loss of muscle mass, in the temporal lobe -- this is in the front of brain and just underneath the very front area of brain.  Definitely some dementia presenting.  I do want them to continue Aricept and ensure they schedule with neurology as soon as possible to get their recommendations on further steps needed.  There are different types of dementia -- this could be frontotemporal dementia which may explain some of his tremor too, but would like to have neurology further review.  Any questions? Keep being amazing!!  Thank you for allowing me to participate in your care.  I appreciate you. Kindest regards, Taisley Mordan

## 2021-03-04 ENCOUNTER — Telehealth: Payer: Self-pay

## 2021-03-04 NOTE — Telephone Encounter (Signed)
Copied from [place] [id number]. Topic: General - Other >> Mar 04, 2021  2:52 PM Leward Quan A wrote: Reason for CRM: Patient wife called in to inform Marnee Guarneri that they are still waiting to hear from the office he was referred to for an MRI stae that it has been about 2 weeks please advise Ph# [phone]   Evelena Peat can you look into this referral please? Patient has not heard anything regarding neurology referral?

## 2021-03-17 ENCOUNTER — Other Ambulatory Visit: Payer: Self-pay

## 2021-03-17 ENCOUNTER — Ambulatory Visit: Payer: PRIVATE HEALTH INSURANCE

## 2021-03-17 DIAGNOSIS — Z79899 Other long term (current) drug therapy: Secondary | ICD-10-CM

## 2021-03-17 NOTE — Progress Notes (Signed)
Medication Management Clinic Visit Note  Patient: Michael Zuniga MRN: [id number] Date of Birth: 02/24/1951 PCP: Venita Lick, NP   Michael Zuniga 70 y.o. male presents for a MTM visit today. Identified in person by name and date of birth.   There were no vitals taken for this visit.  Patient Information   Past Medical History:  Diagnosis Date   Coronary artery disease    Dysphagia    High blood pressure       Past Surgical History:  Procedure Laterality Date   CORONARY ANGIOPLASTY WITH STENT PLACEMENT     ESOPHAGOGASTRODUODENOSCOPY (EGD) WITH PROPOFOL N/A 07/11/2019   Procedure: ESOPHAGOGASTRODUODENOSCOPY (EGD) WITH PROPOFOL;  Surgeon: Lin Landsman, MD;  Location: [facility] ENDOSCOPY;  Service: Gastroenterology;  Laterality: N/A;   HERNIA REPAIR       Family History  Problem Relation Age of Onset   Lung cancer Mother    Lung cancer Father    Thyroid disease Daughter    Arthritis Maternal Grandmother    Colon cancer Neg Hx     New Diagnoses (since last visit): memory (neurologist appointment in October)  Family Support: Good  Lifestyle Diet: Breakfast: grits, oatmeal Lunch: non-specified small meal Dinner: non-specified small meal Eats regularly; spouse noted that patient has been eating smaller meals          Social History   Substance and Sexual Activity  Alcohol Use No      Social History   Tobacco Use  Smoking Status Never  Smokeless Tobacco Never      Health Maintenance  Topic Date Due   COVID-19 Vaccine (1) Never done   Zoster Vaccines- Shingrix (2 of 2) 12/16/2017   INFLUENZA VACCINE  01/26/2021   COLONOSCOPY (Pts 45-48yrs Insurance coverage will need to be confirmed)  03/02/2021   TETANUS/TDAP  03/05/2029   Hepatitis C Screening  Completed   HPV VACCINES  Aged Out    Outpatient Encounter Medications as of 03/17/2021  Medication Sig   amLODipine (NORVASC) 10 MG tablet Take 1 tablet (10 mg total) by mouth daily.   aspirin EC 81  MG tablet Take 81 mg by mouth daily.   atorvastatin (LIPITOR) 40 MG tablet Take 1 tablet (40 mg total) by mouth daily.   donepezil (ARICEPT) 5 MG tablet Take 1 tablet (5 mg total) by mouth at bedtime.   losartan (COZAAR) 50 MG tablet Take 1 tablet (50 mg total) by mouth daily.   Omega-3 Fatty Acids (FISH OIL) 1200 MG CAPS Take 1 capsule by mouth daily.   omeprazole (PRILOSEC) 20 MG capsule Take 20 mg by mouth daily.   propranolol (INDERAL) 20 MG tablet Take 1 tablet (20 mg total) by mouth daily.   sildenafil (REVATIO) 20 MG tablet TAKE 1-5 TABLETS AS NEEDED   vitamin B-12 (CYANOCOBALAMIN) 1000 MCG tablet Take 1,000 mcg by mouth daily.   [DISCONTINUED] omeprazole (PRILOSEC) 40 MG capsule Take 1 capsule (40 mg total) by mouth 2 (two) times daily before a meal.   No facility-administered encounter medications on file as of 03/17/2021.   Wt: 159 lb BP: 123/70   Health Maintenance/Date Completed  Last ED visit: n/a Last Visit to PCP: 8/16 Next Visit to PCP: 9/28 Specialist Visit: 10/13 neurologist Dental Exam: April 2022 Eye Exam: None Prostate Exam: No DEXA: No Colonoscopy: 2017 Flu Vaccine: last year Pneumonia Vaccine: yes (multiple doses) COVID-19 Vaccine: Pfizer (2 doses + booster) Shingrix Vaccine: has not received Shingrix (Zostavax previously)   Assessment and Plan:  HTN- Blood pressure at goal on amlodipine and losartan. Patient has blood pressure meter at home, checking BP twice a day (Home values ~128/70; noted a HR 51).  HLD and CAD s/p stent- Managed on high-intensity statin and aspirin with recent LDL above goal (LDL 103 on 7/22). Patient is regularly seeing primary care for lipid panel checks.  Tremor: Managed on propranolol.  Memory- Managed on donepezil 5 mg QHS. Denies changes in bowel movements or dizziness. Home BP and HR noted as above. Spouse is concerned of recent subjective weight loss; reported weight of 150 lbs has decreased from 170 lbs. Educated patient on  home meals and recommended eating three meals a day. B12 deficiency- On supplementation. Seeing primary care to monitor levels.  Adherence: Patient keeps all medications in a bag together to assist in adherence. Using pill box to help remember to take donepezil at night. Other: Patient has Medicare Part D. Advised patient to request transfers to the pharmacy of their choice.   RTC: N/a (patient now has insurance)

## 2021-03-25 ENCOUNTER — Ambulatory Visit (INDEPENDENT_AMBULATORY_CARE_PROVIDER_SITE_OTHER): Payer: Medicare Other | Admitting: Nurse Practitioner

## 2021-03-25 ENCOUNTER — Other Ambulatory Visit: Payer: Self-pay

## 2021-03-25 ENCOUNTER — Encounter: Payer: Self-pay | Admitting: Nurse Practitioner

## 2021-03-25 DIAGNOSIS — F0390 Unspecified dementia without behavioral disturbance: Secondary | ICD-10-CM

## 2021-03-25 MED ORDER — DONEPEZIL HCL 10 MG PO TABS: 10.0000 mg | ORAL_TABLET | Freq: Every day | ORAL | 4 refills | Status: DC

## 2021-03-25 NOTE — Assessment & Plan Note (Addendum)
Dementia, volume loss noted on MRI.  6CIT = 28 today and 21 last visit, poor recall and not able to state month or year.  Recent labs stable with exception of mild low B12, started supplement and improvement in this.  Seeing neurology on October 13th for initial visit.  Will increase Aricept to 10 MG and adjust accordingly.  Return in 6 weeks.

## 2021-03-25 NOTE — Patient Instructions (Signed)

## 2021-03-25 NOTE — Progress Notes (Addendum)
BP 129/75   Pulse (!) 56 Comment: apical  Temp 98.1 F (36.7 C) (Oral)   Wt 160 lb (72.6 kg)   SpO2 96%   BMI 24.03 kg/m    Subjective:    Patient ID: Michael Zuniga, male    DOB: 07-03-1950, 70 y.o.   MRN: [id number]  HPI: Michael Zuniga is a 70 y.o. male  Chief Complaint  Patient presents with   Memory Changes    Patient is here to follow up on Memory Changes. Patient denies having any concerns at today's visit.    TREMOR & MEMORY CHANGES Follow-up for memory changes. Reports ongoing issues for several years intermittently with tremor.  Started trial of Propranolol 20 MG last visit, 11/03/20 + started Aricept 5 MG on 12/01/20.  Improvement in tremor at this time with medication on board, but memory remains baseline.  Denies ever heavy drinking.  Not currently drinking any alcohol.  Wife at bedside and reports memory is the same, no worsening.  Difficulty with number recall.  Had Covid around October 2021.  Reports noticing it about one year before Covid, but became worse after Covid.  Difficulty finding things if given directions per his wife.  Wife reports normal gait at home (walks fast), no recent falls, no slurred speech, weakness, headaches, or change in facial symmetry noted.    His B12 level recent visit was 334, supplement started == level recently was improved. Aspirin: yes Recurrent headaches: no Visual changes: no Palpitations: no Dyspnea: no Chest pain: no Lower extremity edema: no Dizzy/lightheaded: no  6CIT Screen 03/25/2021 12/30/2020 12/01/2020 04/16/2020 03/06/2019  What Year? 4 points 0 points 4 points 0 points 0 points  What month? 3 points 0 points 3 points 0 points 0 points  What time? 3 points 3 points 3 points 0 points 0 points  Count back from 20 4 points 4 points 4 points 0 points 0 points  Months in reverse 4 points 4 points 4 points 0 points 0 points  Repeat phrase 10 points 10 points 10 points 0 points 0 points  Total Score 28 21 28  0 0    Relevant past  medical, surgical, family and social history reviewed and updated as indicated. Interim medical history since our last visit reviewed. Allergies and medications reviewed and updated.  Review of Systems  Constitutional:  Negative for activity change, diaphoresis, fatigue and fever.  Respiratory:  Negative for cough, chest tightness, shortness of breath and wheezing.   Cardiovascular:  Negative for chest pain, palpitations and leg swelling.  Gastrointestinal: Negative.   Neurological:  Positive for tremors. Negative for dizziness, seizures, facial asymmetry, speech difficulty, weakness and headaches.  Psychiatric/Behavioral:  Positive for confusion (memory changes). Negative for self-injury, sleep disturbance and suicidal ideas. The patient is not nervous/anxious.    Per HPI unless specifically indicated above     Objective:    BP 129/75   Pulse (!) 56 Comment: apical  Temp 98.1 F (36.7 C) (Oral)   Wt 160 lb (72.6 kg)   SpO2 96%   BMI 24.03 kg/m   Wt Readings from Last 3 Encounters:  03/25/21 160 lb (72.6 kg)  02/10/21 160 lb 3.2 oz (72.7 kg)  12/30/20 163 lb 12.8 oz (74.3 kg)    Physical Exam Vitals and nursing note reviewed.  Constitutional:      General: He is awake. He is not in acute distress.    Appearance: He is well-developed and well-groomed. He is not ill-appearing.  HENT:  Head: Normocephalic and atraumatic.     Right Ear: Hearing normal. No drainage.     Left Ear: Hearing normal. No drainage.  Eyes:     General: Lids are normal.        Right eye: No discharge.        Left eye: No discharge.     Conjunctiva/sclera: Conjunctivae normal.     Pupils: Pupils are equal, round, and reactive to light.  Neck:     Vascular: No carotid bruit.  Cardiovascular:     Rate and Rhythm: Normal rate and regular rhythm.     Heart sounds: Normal heart sounds, S1 normal and S2 normal. No murmur heard.   No gallop.  Pulmonary:     Effort: Pulmonary effort is normal. No  accessory muscle usage or respiratory distress.     Breath sounds: Normal breath sounds.  Abdominal:     General: Bowel sounds are normal.     Palpations: Abdomen is soft.  Musculoskeletal:        General: Normal range of motion.     Cervical back: Normal range of motion and neck supple.     Right lower leg: No edema.     Left lower leg: No edema.  Skin:    General: Skin is warm and dry.  Neurological:     Mental Status: He is alert.     Cranial Nerves: Cranial nerves are intact.     Motor: No weakness, tremor (none noted today, stable) or atrophy.     Coordination: Romberg sign negative. Finger-Nose-Finger Test and Heel to [place] Test normal.     Gait: Gait is intact.     Deep Tendon Reflexes: Reflexes are normal and symmetric.     Reflex Scores:      Brachioradialis reflexes are 2+ on the right side and 2+ on the left side.      Patellar reflexes are 2+ on the right side.    Comments: Normal gait, no shuffle or shortened gait.  No tremor noted today, active or resting.  No rigidity on exam.  No flat affect.  Not oriented to year, month, or day -- aware he is at provider office.  Psychiatric:        Attention and Perception: Attention normal.        Mood and Affect: Mood normal.        Speech: Speech normal.        Behavior: Behavior normal. Behavior is cooperative.    Results for orders placed or performed in visit on 02/10/21  Vitamin B12  Result Value Ref Range   Vitamin B-12 1,055 232 - 1,245 pg/mL      Assessment & Plan:   Problem List Items Addressed This Visit       Nervous and Auditory   Dementia ([place])    Dementia, volume loss noted on MRI.  6CIT = 28 today and 21 last visit, poor recall and not able to state month or year.  Recent labs stable with exception of mild low B12, started supplement and improvement in this.  Seeing neurology on October 13th for initial visit.  Will increase Aricept to 10 MG and adjust accordingly.  Return in 6 weeks.      Relevant  Medications   donepezil (ARICEPT) 10 MG tablet     Follow up plan: Return in about 5 weeks (around 04/28/2021) for Dementia follow-up.

## 2021-04-03 ENCOUNTER — Telehealth: Payer: Self-pay

## 2021-04-03 ENCOUNTER — Telehealth: Payer: Medicare Other

## 2021-04-03 ENCOUNTER — Telehealth: Payer: Self-pay | Admitting: Pharmacy Technician

## 2021-04-03 NOTE — Telephone Encounter (Signed)
Patient has Medicare A, B & D.  No longer meets [facility]'s eligibility criteria.  [place], [place]  [address]   April 02, 2021    [place] [facility] [address] [place], [place]  [address]  Dear Jenny Reichmann:  This is to inform you that you are no longer eligible to receive medication assistance at [facility].  The reason(s) are:    _____Your total gross monthly household income exceeds 250% of the Federal Poverty Level.   _____Tangible assets (savings, checking, stocks/bonds, pension, retirement, etc.) exceeds our limit  _____You are eligible to receive benefits from [facility], [facility] or HIV Medication            Assistance program __X__You are eligible to receive benefits from a Medicare Part "D" plan __X__You have prescription insurance with [employer] _____You are not an [facility] resident _____Failure to provide all requested documentation (proof of income information for 2022., and/or Patient Intake Application, [place], Contract, etc).    We regret that we are unable to help you at this time.  If your prescription coverage is terminated, please contact [facility], so that we may reassess your eligibility for our program.  If you have questions, we may be contacted at [phone].  Thank you,  [facility]

## 2021-04-03 NOTE — Telephone Encounter (Signed)
  Care Management   Follow Up Note   04/03/2021 Name: Michael Zuniga MRN: [id number] DOB: 11-29-1950   Referred by: Venita Lick, NP Reason for referral : Chronic Care Management (Follow up for Chronic Disease Management and Care Coordination Needs)   An unsuccessful telephone outreach was attempted today. The patient was referred to the case management team for assistance with care management and care coordination.   Follow Up Plan: A HIPPA compliant phone message was left for the patient providing contact information and requesting a return call.  Noreene Larsson RN, MSN, [facility] Mobile: [phone]

## 2021-04-09 DIAGNOSIS — R9089 Other abnormal findings on diagnostic imaging of central nervous system: Secondary | ICD-10-CM | POA: Diagnosis not present

## 2021-04-09 DIAGNOSIS — Z789 Other specified health status: Secondary | ICD-10-CM | POA: Diagnosis not present

## 2021-04-09 DIAGNOSIS — R413 Other amnesia: Secondary | ICD-10-CM | POA: Diagnosis not present

## 2021-04-09 DIAGNOSIS — E538 Deficiency of other specified B group vitamins: Secondary | ICD-10-CM | POA: Diagnosis not present

## 2021-04-09 DIAGNOSIS — Z7689 Persons encountering health services in other specified circumstances: Secondary | ICD-10-CM | POA: Diagnosis not present

## 2021-04-14 ENCOUNTER — Telehealth: Payer: Self-pay

## 2021-04-14 NOTE — Chronic Care Management (AMB) (Signed)
  Care Management   Note  04/14/2021 Name: [name] MRN: [id number] DOB: Jan 26, 1951  Michael Zuniga is a 70 y.o. year old male who is a primary care patient of Venita Lick, NP and is actively engaged with the care management team. I reached out to Michael Zuniga by phone today to assist with re-scheduling a follow up visit with the RN Case Manager  Follow up plan: Unsuccessful telephone outreach attempt made. A HIPAA compliant phone message was left for the patient providing contact information and requesting a return call.  The care management team will reach out to the patient again over the next 7 days.  If patient returns call to provider office, please advise to call [facility]  at [place], [place], [place], [place] [address] Direct Dial: [phone] [email or web address]

## 2021-04-29 ENCOUNTER — Ambulatory Visit (INDEPENDENT_AMBULATORY_CARE_PROVIDER_SITE_OTHER): Payer: Medicare Other | Admitting: Nurse Practitioner

## 2021-04-29 ENCOUNTER — Encounter: Payer: Self-pay | Admitting: Nurse Practitioner

## 2021-04-29 ENCOUNTER — Telehealth: Payer: Self-pay | Admitting: Nurse Practitioner

## 2021-04-29 ENCOUNTER — Other Ambulatory Visit: Payer: Self-pay

## 2021-04-29 VITALS — BP 137/74 | HR 45 | Temp 98.0°F | Ht 68.2 in | Wt 165.6 lb

## 2021-04-29 DIAGNOSIS — E538 Deficiency of other specified B group vitamins: Secondary | ICD-10-CM

## 2021-04-29 DIAGNOSIS — Z23 Encounter for immunization: Secondary | ICD-10-CM

## 2021-04-29 DIAGNOSIS — F03B0 Unspecified dementia, moderate, without behavioral disturbance, psychotic disturbance, mood disturbance, and anxiety: Secondary | ICD-10-CM | POA: Diagnosis not present

## 2021-04-29 NOTE — Progress Notes (Signed)
BP 137/74   Pulse (!) 45   Temp 98 F (36.7 C) (Oral)   Ht 5' 8.2" (1.732 m)   Wt 165 lb 9.6 oz (75.1 kg)   SpO2 98%   BMI 25.03 kg/m    Subjective:    Patient ID: Michael Zuniga, male    DOB: January 04, 1951, 70 y.o.   MRN: [id number]  HPI: Michael Zuniga is a 70 y.o. male  Chief Complaint  Patient presents with   Dementia   MEMORY CHANGES Follow-up for memory changes.  Was seen by neurology for initial visit on 04/09/21 and Aricept continued with plan to add on Namenda in future as needed. Started Aricept 5 MG on 12/01/20, currently on 10 MG.  Denies ever heavy drinking.  Not currently drinking any alcohol.  Wife at bedside and reports memory is the same, no worsening.  Difficulty with number recall and short term memory.  Had Covid around October 2021.  Reports noticing it about one year before Covid, but became worse after Covid.  Difficulty finding things if given directions per his wife.  Wife reports no recent falls, no slurred speech, weakness, headaches, or change in facial symmetry noted.    His B12 level recent visit was 334, supplement started == level recently was improved. Aspirin: yes Recurrent headaches: no Visual changes: no Palpitations: no Dyspnea: no Chest pain: no Lower extremity edema: no Dizzy/lightheaded: no  6CIT Screen 03/25/2021 12/30/2020 12/01/2020 04/16/2020 03/06/2019  What Year? 4 points 0 points 4 points 0 points 0 points  What month? 3 points 0 points 3 points 0 points 0 points  What time? 3 points 3 points 3 points 0 points 0 points  Count back from 20 4 points 4 points 4 points 0 points 0 points  Months in reverse 4 points 4 points 4 points 0 points 0 points  Repeat phrase 10 points 10 points 10 points 0 points 0 points  Total Score 28 21 28  0 0    A voluntary discussion about advance care planning including the explanation and discussion of advance directives was extensively discussed  with the patient for 15 minutes with patient and wife present.   Explanation about the health care proxy and Living will was reviewed and packet with forms with explanation of how to fill them out was given.  During this discussion, the patient was able to identify a health care proxy as wife and plans to fill out the paperwork required.  Patient was offered a separate [place] visit for further assistance with forms.     Relevant past medical, surgical, family and social history reviewed and updated as indicated. Interim medical history since our last visit reviewed. Allergies and medications reviewed and updated.  Review of Systems  Constitutional:  Negative for activity change, diaphoresis, fatigue and fever.  Respiratory:  Negative for cough, chest tightness, shortness of breath and wheezing.   Cardiovascular:  Negative for chest pain, palpitations and leg swelling.  Gastrointestinal: Negative.   Neurological:  Positive for tremors. Negative for dizziness, seizures, facial asymmetry, speech difficulty, weakness and headaches.  Psychiatric/Behavioral:  Positive for confusion (memory changes). Negative for self-injury, sleep disturbance and suicidal ideas. The patient is not nervous/anxious.    Per HPI unless specifically indicated above     Objective:    BP 137/74   Pulse (!) 45   Temp 98 F (36.7 C) (Oral)   Ht 5' 8.2" (1.732 m)   Wt 165 lb 9.6 oz (75.1 kg)  SpO2 98%   BMI 25.03 kg/m   Wt Readings from Last 3 Encounters:  04/29/21 165 lb 9.6 oz (75.1 kg)  03/25/21 160 lb (72.6 kg)  02/10/21 160 lb 3.2 oz (72.7 kg)    Physical Exam Vitals and nursing note reviewed.  Constitutional:      General: He is awake. He is not in acute distress.    Appearance: He is well-developed and well-groomed. He is not ill-appearing.  HENT:     Head: Normocephalic and atraumatic.     Right Ear: Hearing normal. No drainage.     Left Ear: Hearing normal. No drainage.  Eyes:     General: Lids are normal.        Right eye: No discharge.         Left eye: No discharge.     Conjunctiva/sclera: Conjunctivae normal.     Pupils: Pupils are equal, round, and reactive to light.  Neck:     Vascular: No carotid bruit.  Cardiovascular:     Rate and Rhythm: Normal rate and regular rhythm.     Heart sounds: Normal heart sounds, S1 normal and S2 normal. No murmur heard.   No gallop.  Pulmonary:     Effort: Pulmonary effort is normal. No accessory muscle usage or respiratory distress.     Breath sounds: Normal breath sounds.  Abdominal:     General: Bowel sounds are normal.     Palpations: Abdomen is soft.  Musculoskeletal:        General: Normal range of motion.     Cervical back: Normal range of motion and neck supple.     Right lower leg: No edema.     Left lower leg: No edema.  Skin:    General: Skin is warm and dry.  Neurological:     Mental Status: He is alert.     Motor: No weakness, tremor (none noted today, stable) or atrophy.     Coordination: Romberg sign negative. Finger-Nose-Finger Test and Heel to [place] Test normal.     Gait: Gait is intact.     Deep Tendon Reflexes: Reflexes are normal and symmetric.     Reflex Scores:      Brachioradialis reflexes are 2+ on the right side and 2+ on the left side.      Patellar reflexes are 2+ on the right side.    Comments: Normal gait, no shuffle or shortened gait.  No tremor noted today, active or resting.  No rigidity on exam.  No flat affect.  Not oriented to year, month, or day -- aware he is at provider office.  Psychiatric:        Attention and Perception: Attention normal.        Mood and Affect: Mood normal.        Speech: Speech normal.        Behavior: Behavior normal. Behavior is cooperative.    Results for orders placed or performed in visit on 02/10/21  Vitamin B12  Result Value Ref Range   Vitamin B-12 1,055 232 - 1,245 pg/mL      Assessment & Plan:   Problem List Items Addressed This Visit   None Visit Diagnoses     Need for influenza vaccination    -   Primary   Relevant Orders   Flu Vaccine QUAD High Dose(Fluad)        Follow up plan: Return in about 3 months (around 07/30/2021) for DEMENTIA, HTN/HLD.

## 2021-04-29 NOTE — Telephone Encounter (Signed)
Patient's wife, Blanch Media, has requested for the social worker to contact her regarding upcoming telephone appointment.  She is concerned because they can not pay for a visit.  Please call patient's wife, Blanch Media, at [phone].

## 2021-04-29 NOTE — Patient Instructions (Signed)
Mild Neurocognitive Disorder Mild neurocognitive disorder, formerly known as mild cognitive impairment, is a disorder in which memory does not work as well as it should. This disorder may also cause problems with other mental functions, including thought, communication, behavior, and completion of tasks. These problems can be noticed and measured, but they usually do not interfere with daily activities or the ability to live independently. Mild neurocognitive disorder typically develops after 72 years of age, but it can also develop at younger ages. It is not as serious as major neurocognitive disorder, also known as dementia, but it may be the first sign of it. Generally, symptoms of this condition get worse over time. In rare cases, symptoms can get better. What are the causes? This condition may be caused by: Brain disorders like Alzheimer's disease, Parkinson's disease, and other conditions that gradually damage nerve cells (neurodegenerative conditions). Diseases that affect blood vessels in the brain and result in small strokes. Certain infections, such as HIV. Traumatic brain injury. Other medical conditions, such as brain tumors, underactive thyroid (hypothyroidism), and vitamin B12 deficiency. Use of certain drugs or prescription medicines. What increases the risk? The following factors may make you more likely to develop this condition: Being older than 65 years. Being male. Low education level. Diabetes, high blood pressure, high cholesterol, and other conditions that increase the risk for blood vessel diseases. Untreated or undertreated sleep apnea. Having a certain type of gene that can be passed from parent to child (inherited). Chronic health problems such as heart disease, lung disease, liver disease, kidney disease, or depression. What are the signs or symptoms? Symptoms of this condition include: Difficulty remembering. You may: Forget names, phone numbers, or details of  recent events. Forget social events and appointments. Repeatedly forget where you put your car keys or other items. Difficulty thinking and solving problems. You may have trouble with complex tasks, such as: Paying bills. Driving in unfamiliar places. Difficulty communicating. You may have trouble: Finding the right word or naming an object. Forming a sentence that makes sense, or understanding what you read or hear. Changes in your behavior or personality. When this happens, you may: Lose interest in the things that you used to enjoy. Withdraw from social situations. Get angry more easily than usual. Act before thinking. How is this diagnosed? This condition is diagnosed based on: Your symptoms. Your health care provider may ask you and the people you spend time with, such as family and friends, about your symptoms. Evaluation of mental functions (neuropsychological testing). Your health care provider may refer you to a neurologist or mental health specialist to evaluate your mental functions in detail. To identify the cause of your condition, your health care provider may: Get a detailed medical history. Ask about use of alcohol, drugs, and prescription medicines. Do a physical exam. Order blood tests and brain imaging exams. How is this treated? Mild neurocognitive disorder that is caused by medicine use, drug use, infection, or another medical condition may improve when the cause is treated, or when medicines or drugs are stopped. If this disorder has another cause, it generally does not improve and may get worse. In these cases, the goal of treatment is to help you manage the loss of mental function. Treatments in these cases include: Medicine. Medicine mainly helps memory and behavior symptoms. Talk therapy. Talk therapy provides education, emotional support, memory aids, and other ways of making up for problems with mental function. Lifestyle changes, including: Getting regular  exercise. Eating a healthy diet   that includes omega-3 fatty acids. Challenging your thinking and memory skills. Having more social interaction. Follow these instructions at home: Eating and drinking  Drink enough fluid to keep your urine pale yellow. Eat a healthy diet that includes omega-3 fatty acids. These can be found in: Fish. Nuts. Leafy vegetables. Vegetable oils. If you drink alcohol: Limit how much you use to: 0-1 drink a day for women. 0-2 drinks a day for men. Be aware of how much alcohol is in your drink. In the [place] one drink equals one 12 oz bottle of beer (355 mL), one 5 oz glass of wine (148 mL), or one 1 oz glass of hard liquor (44 mL). Lifestyle  Get regular exercise as told by your health care provider. Do not use any products that contain nicotine or tobacco, such as cigarettes, e-cigarettes, and chewing tobacco. If you need help quitting, ask your health care provider. Practice ways to manage stress. If you need help managing stress, ask your health care provider. Continue to have social interaction. Keep your mind active with stimulating activities you enjoy, such as reading or playing games. Make sure to get quality sleep. Follow these tips: Avoid napping during the day. Keep your sleeping area dark and cool. Avoid exercising during the few hours before you go to bed. Avoid caffeine products in the evening. General instructions Take over-the-counter and prescription medicines only as told by your health care provider. Your health care provider may recommend that you avoid taking medicines that can affect thinking, such as pain medicines or sleep medicines. Work with your health care provider to find out what you need help with and what your safety needs are. Keep all follow-up visits. This is important. Where to find more information [employer] on Aging: http://kim-miller.com/ Contact a health care provider if: You have any new symptoms. Get help right  away if: You develop new confusion or your confusion gets worse. You act in ways that place you or your family in danger. Summary Mild neurocognitive disorder is a disorder in which memory does not work as well as it should. Mild neurocognitive disorder can have many causes. It may be the first stage of dementia. To manage your condition, get regular exercise, keep your mind active, get quality sleep, and eat a healthy diet. This information is not intended to replace advice given to you by your health care provider. Make sure you discuss any questions you have with your health care provider. Document Revised: 10/29/2019 Document Reviewed: 10/29/2019 Elsevier Patient Education  [place].

## 2021-04-29 NOTE — Assessment & Plan Note (Signed)
Dementia, volume loss noted on MRI.  6CIT = 28 and 21 recent checks, poor recall and not able to state month or year.  Recent labs stable with exception of mild low B12, started supplement and improvement in this.  Continue to collaborate with neurology.  Recommend crossword puzzles at home and reading.  Will continue Aricept to 10 MG and adjust accordingly.  Return in 3 months.

## 2021-04-29 NOTE — Assessment & Plan Note (Signed)
Noted on recent labs, wife has started him on supplement.  Continue this and recent level stable.

## 2021-04-30 ENCOUNTER — Telehealth: Payer: Self-pay | Admitting: Licensed Clinical Social Worker

## 2021-04-30 NOTE — Telephone Encounter (Signed)
    Clinical Social Work  Care Management   Phone Outreach    04/30/2021 Name: RAYSHUN KANDLER MRN: [id number] DOB: 1951/01/03  Madie Reno Talcott is a 70 y.o. year old male who is a primary care patient of Cannady, Barbaraann Faster, NP .   Reason for referral: Mental Health Counseling and Resources and Caregiver Stress.    F/U phone call today to assess needs, progress and barriers with care plan goals.   Telephone outreach was unsuccessful. A HIPPA compliant phone message was left for the patient providing contact information and requesting a return call.   Plan: [facility]CM LCSW will wait for return call. If no return call is received, will reach out to patient again on 05/07/21 .   Review of patient status, including review of consultants reports, relevant laboratory and other test results, and collaboration with appropriate care team members and the patient's provider was performed as part of comprehensive patient evaluation and provision of care management services.     Christa See, MSW, [email or web address] Phone [phone] 10:13 AM

## 2021-05-04 NOTE — Chronic Care Management (AMB) (Signed)
  Care Management   Note  05/04/2021 Name: Michael Zuniga MRN: [id number] DOB: November 27, 1950  Michael Zuniga is a 70 y.o. year old male who is a primary care patient of Venita Lick, NP and is actively engaged with the care management team. I reached out to Michael Zuniga by phone today to assist with re-scheduling a follow up visit with the RN Case Manager  Follow up plan: Unsuccessful telephone outreach attempt made. A HIPAA compliant phone message was left for the patient providing contact information and requesting a return call.  The care management team will reach out to the patient again over the next 7 days.  If patient returns call to provider office, please advise to call [place]  at [place], [place], [place], [place] [address] Direct Dial: [phone] [email or web address] Website: [email or web address]

## 2021-05-07 ENCOUNTER — Telehealth: Payer: Medicare Other

## 2021-05-11 ENCOUNTER — Telehealth: Payer: Self-pay | Admitting: Licensed Clinical Social Worker

## 2021-05-11 NOTE — Chronic Care Management (AMB) (Signed)
  Care Management   Note  05/11/2021 Name: Michael Zuniga MRN: [id number] DOB: 05-Jun-1951  Michael Zuniga is a 70 y.o. year old male who is a primary care patient of Venita Lick, NP and is actively engaged with the care management team. I reached out to Michael Reno Gu by phone today to assist with re-scheduling a follow up visit with the RN Case Manager  Follow up plan: Patient declines further follow up and engagement by the care management team. Appropriate care team members and provider have been notified via electronic communication.   Noreene Larsson, [place], [place], [place] [address] Direct Dial: [phone] [email or web address] Website: [email or web address]

## 2021-05-11 NOTE — Telephone Encounter (Signed)
    Clinical Social Work  Care Management   Phone Outreach    05/11/2021 Name: Michael Zuniga MRN: [id number] DOB: [birth year]  Michael Zuniga is a 70 y.o. year old male who is a primary care patient of Cannady, Barbaraann Faster, NP .   Reason for referral: [employer]  and Caregiver Stress.    3rd unsuccessful telephone outreach was attempted today. The patient was referred to the case management team for assistance with care management and care coordination. The patient's primary care provider has been notified of our unsuccessful attempts to make or maintain contact with the patient. The care management team is pleased to engage with this patient at any time in the future should he/she be interested in assistance from the care management team.   Plan: Three unsuccessful attempts were completed without response from patient. Should needs arise, PCP can complete another referral for LCSW services.    Review of patient status, including review of consultants reports, relevant laboratory and other test results, and collaboration with appropriate care team members and the patient's provider was performed as part of comprehensive patient evaluation and provision of care management services.    Christa See, [email or web address] Phone [phone] 6:36 AM

## 2021-07-31 ENCOUNTER — Ambulatory Visit: Payer: Medicare Other | Admitting: Nurse Practitioner

## 2021-07-31 DIAGNOSIS — F03B0 Unspecified dementia, moderate, without behavioral disturbance, psychotic disturbance, mood disturbance, and anxiety: Secondary | ICD-10-CM

## 2021-07-31 DIAGNOSIS — E538 Deficiency of other specified B group vitamins: Secondary | ICD-10-CM

## 2021-07-31 DIAGNOSIS — E7800 Pure hypercholesterolemia, unspecified: Secondary | ICD-10-CM

## 2021-07-31 DIAGNOSIS — I10 Essential (primary) hypertension: Secondary | ICD-10-CM

## 2021-08-09 NOTE — Patient Instructions (Signed)

## 2021-08-10 DIAGNOSIS — E538 Deficiency of other specified B group vitamins: Secondary | ICD-10-CM | POA: Diagnosis not present

## 2021-08-10 DIAGNOSIS — R9089 Other abnormal findings on diagnostic imaging of central nervous system: Secondary | ICD-10-CM | POA: Diagnosis not present

## 2021-08-10 DIAGNOSIS — Z789 Other specified health status: Secondary | ICD-10-CM | POA: Diagnosis not present

## 2021-08-10 DIAGNOSIS — R413 Other amnesia: Secondary | ICD-10-CM | POA: Diagnosis not present

## 2021-08-14 ENCOUNTER — Other Ambulatory Visit: Payer: Self-pay

## 2021-08-14 ENCOUNTER — Encounter: Payer: Self-pay | Admitting: Nurse Practitioner

## 2021-08-14 ENCOUNTER — Ambulatory Visit (INDEPENDENT_AMBULATORY_CARE_PROVIDER_SITE_OTHER): Payer: Medicare Other | Admitting: Nurse Practitioner

## 2021-08-14 VITALS — BP 113/65 | HR 58 | Temp 97.9°F | Ht 68.2 in | Wt 163.8 lb

## 2021-08-14 DIAGNOSIS — G250 Essential tremor: Secondary | ICD-10-CM

## 2021-08-14 DIAGNOSIS — E7800 Pure hypercholesterolemia, unspecified: Secondary | ICD-10-CM

## 2021-08-14 DIAGNOSIS — F02B0 Dementia in other diseases classified elsewhere, moderate, without behavioral disturbance, psychotic disturbance, mood disturbance, and anxiety: Secondary | ICD-10-CM | POA: Diagnosis not present

## 2021-08-14 DIAGNOSIS — K2100 Gastro-esophageal reflux disease with esophagitis, without bleeding: Secondary | ICD-10-CM

## 2021-08-14 DIAGNOSIS — E538 Deficiency of other specified B group vitamins: Secondary | ICD-10-CM

## 2021-08-14 DIAGNOSIS — R7989 Other specified abnormal findings of blood chemistry: Secondary | ICD-10-CM | POA: Diagnosis not present

## 2021-08-14 DIAGNOSIS — I2510 Atherosclerotic heart disease of native coronary artery without angina pectoris: Secondary | ICD-10-CM

## 2021-08-14 DIAGNOSIS — I10 Essential (primary) hypertension: Secondary | ICD-10-CM | POA: Diagnosis not present

## 2021-08-14 DIAGNOSIS — G301 Alzheimer's disease with late onset: Secondary | ICD-10-CM

## 2021-08-14 DIAGNOSIS — F03B0 Unspecified dementia, moderate, without behavioral disturbance, psychotic disturbance, mood disturbance, and anxiety: Secondary | ICD-10-CM

## 2021-08-14 MED ORDER — OMEPRAZOLE 20 MG PO CPDR: 20.0000 mg | DELAYED_RELEASE_CAPSULE | Freq: Every day | ORAL | 4 refills | Status: DC

## 2021-08-14 NOTE — Assessment & Plan Note (Signed)
Dementia, volume loss noted on MRI.  6CIT = 28 and 21 recent checks, poor recall and not able to state month, city, or year.  Recent labs stable, however is taking B12 for history of low levels.  Continue to collaborate with neurology.  Recommend crossword puzzles at home and reading.  Will continue Aricept to 10 MG and Namenda as ordered by neurology.  Return in 6 months.

## 2021-08-14 NOTE — Assessment & Plan Note (Signed)
Chronic, ongoing.  They request refills on Omeprazole today, will send these in and plan to check Mag level next visit.

## 2021-08-14 NOTE — Assessment & Plan Note (Signed)
Chronic, stable level recent labs.  Continue supplement at home and adjust as needed.

## 2021-08-14 NOTE — Assessment & Plan Note (Signed)
Continue daily ASA and Lipitor, adjust Lipitor dose as needed to meet goals and prevent stroke.

## 2021-08-14 NOTE — Assessment & Plan Note (Signed)
Ongoing for several years.  Has improved with Propranolol, suspect more essential tremor. Monitor HR with Propranolol on board, as has trended down.  May need to adjust medications future visit.  At this time is tolerating well and has improvement in symptoms.

## 2021-08-14 NOTE — Assessment & Plan Note (Signed)
Chronic, ongoing.  Continue current medication regimen and adjust as needed. Lipid panel today. 

## 2021-08-14 NOTE — Assessment & Plan Note (Signed)
Chronic, ongoing. BP at goal in office today.  Recommend he monitor BP at least a few mornings a week at home and document.  DASH diet at home.  Continue current medication regimen and adjust as needed, to medication management.  Labs: CBC and CMP .  Return in 6 months.

## 2021-08-14 NOTE — Progress Notes (Signed)
BP 113/65    Pulse (!) 58    Temp 97.9 F (36.6 C) (Oral)    Ht 5' 8.2" (1.732 m)    Wt 163 lb 12.8 oz (74.3 kg)    SpO2 96%    BMI 24.76 kg/m    Subjective:    Patient ID: Michael Zuniga, male    DOB: 1950-09-14, 71 y.o.   MRN: [id number]  HPI: Michael Zuniga is a 71 y.o. male  Chief Complaint  Patient presents with   Dementia   Hyperlipidemia   Hypertension   HYPERTENSION / HYPERLIPIDEMIA Currently on Amlodipine, ASA, Lipitor, and Losartan. Satisfied with current treatment? yes Duration of hypertension: chronic BP monitoring frequency: not checking BP range:  BP medication side effects: no Duration of hyperlipidemia: chronic Cholesterol medication side effects: no Cholesterol supplements: none Medication compliance: good compliance Aspirin: yes Recent stressors: no Recurrent headaches: no Visual changes: no Palpitations: no Dyspnea: no Chest pain: no Lower extremity edema: no Dizzy/lightheaded: no   ALZHEIMER'S DEMENTIA Follow-up for memory changes.  Was seen by neurology for initial visit on 04/09/21 and Aricept continued with plan to add on Namenda in future as needed. Started Aricept 5 MG on 12/01/20, currently on 10 MG.  Last saw neurology on 08/10/21, Dr. Melrose Nakayama, to continue Aricept and they added on Namenda.  Continues on Propranolol, started by PCP for tremor, this has vastly improved.  Difficulty with number recall and short term memory.  Had Covid October 2021.  Reports noticing memory changes one year before Covid, but became worse after Covid.  Difficulty finding things if given directions per his wife.  Wife reports no recent falls, no slurred speech, weakness, headaches, or change in facial symmetry noted.  His wife and him both report no progression in memory changes, they report he is staying about the same.  He continues B12 supplementation for history of low levels. Aspirin: yes Recurrent headaches: no Visual changes: no Palpitations: no Dyspnea:  no Chest pain: no Lower extremity edema: no Dizzy/lightheaded: no  6CIT Screen 08/14/2021 03/25/2021 12/30/2020 12/01/2020 04/16/2020  What Year? 4 points 4 points 0 points 4 points 0 points  What month? 3 points 3 points 0 points 3 points 0 points  What time? 3 points 3 points 3 points 3 points 0 points  Count back from 20 4 points 4 points 4 points 4 points 0 points  Months in reverse 4 points 4 points 4 points 4 points 0 points  Repeat phrase 10 points 10 points 10 points 10 points 0 points  Total Score 28 28 21 28  0   Relevant past medical, surgical, family and social history reviewed and updated as indicated. Interim medical history since our last visit reviewed. Allergies and medications reviewed and updated.  Review of Systems  Constitutional:  Negative for activity change, diaphoresis, fatigue and fever.  Respiratory:  Negative for cough, chest tightness, shortness of breath and wheezing.   Cardiovascular:  Negative for chest pain, palpitations and leg swelling.  Gastrointestinal: Negative.   Neurological:  Negative for dizziness, tremors, seizures, facial asymmetry, speech difficulty, weakness and headaches.  Psychiatric/Behavioral:  Positive for confusion (memory changes). Negative for self-injury, sleep disturbance and suicidal ideas. The patient is not nervous/anxious.    Per HPI unless specifically indicated above     Objective:    BP 113/65    Pulse (!) 58    Temp 97.9 F (36.6 C) (Oral)    Ht 5' 8.2" (1.732 m)    Wt  163 lb 12.8 oz (74.3 kg)    SpO2 96%    BMI 24.76 kg/m   Wt Readings from Last 3 Encounters:  08/14/21 163 lb 12.8 oz (74.3 kg)  04/29/21 165 lb 9.6 oz (75.1 kg)  03/25/21 160 lb (72.6 kg)    Physical Exam Vitals and nursing note reviewed.  Constitutional:      General: He is awake. He is not in acute distress.    Appearance: He is well-developed and well-groomed. He is not ill-appearing.  HENT:     Head: Normocephalic and atraumatic.     Right Ear:  Hearing normal. No drainage.     Left Ear: Hearing normal. No drainage.  Eyes:     General: Lids are normal.        Right eye: No discharge.        Left eye: No discharge.     Conjunctiva/sclera: Conjunctivae normal.     Pupils: Pupils are equal, round, and reactive to light.  Neck:     Vascular: No carotid bruit.  Cardiovascular:     Rate and Rhythm: Regular rhythm. Bradycardia present.     Heart sounds: Normal heart sounds, S1 normal and S2 normal. No murmur heard.   No gallop.  Pulmonary:     Effort: Pulmonary effort is normal. No accessory muscle usage or respiratory distress.     Breath sounds: Normal breath sounds.  Abdominal:     General: Bowel sounds are normal.     Palpations: Abdomen is soft.  Musculoskeletal:        General: Normal range of motion.     Cervical back: Normal range of motion and neck supple.     Right lower leg: No edema.     Left lower leg: No edema.  Skin:    General: Skin is warm and dry.  Neurological:     Mental Status: He is alert.     Motor: No weakness, tremor (none noted today, stable) or atrophy.     Coordination: Romberg sign negative. Finger-Nose-Finger Test and Heel to [place] Test normal.     Gait: Gait is intact.     Deep Tendon Reflexes: Reflexes are normal and symmetric.     Reflex Scores:      Brachioradialis reflexes are 2+ on the right side and 2+ on the left side.      Patellar reflexes are 2+ on the right side.    Comments: Normal gait, no shuffle or shortened gait.  No tremor noted today, active or resting.  No rigidity on exam.  No flat affect.  Not oriented to year, month, or day -- aware he is at provider office.  Psychiatric:        Attention and Perception: Attention normal.        Mood and Affect: Mood normal.        Speech: Speech normal.        Behavior: Behavior normal. Behavior is cooperative.    Results for orders placed or performed in visit on 02/10/21  Vitamin B12  Result Value Ref Range   Vitamin B-12 1,055  232 - 1,245 pg/mL      Assessment & Plan:   Problem List Items Addressed This Visit       Cardiovascular and Mediastinum   CAD (coronary artery disease)    Continue daily ASA and Lipitor, adjust Lipitor dose as needed to meet goals and prevent stroke.      Hypertension    Chronic, ongoing. BP  at goal in office today.  Recommend he monitor BP at least a few mornings a week at home and document.  DASH diet at home.  Continue current medication regimen and adjust as needed, to medication management.  Labs: CBC and CMP .  Return in 6 months.       Relevant Orders   Comprehensive metabolic panel   CBC with Differential/Platelet     Digestive   Gastroesophageal reflux disease with esophagitis without hemorrhage    Chronic, ongoing.  They request refills on Omeprazole today, will send these in and plan to check Mag level next visit.        Nervous and Auditory   Alzheimer's dementia ([place]) - Primary    Dementia, volume loss noted on MRI.  6CIT = 28 and 21 recent checks, poor recall and not able to state month, city, or year.  Recent labs stable, however is taking B12 for history of low levels.  Continue to collaborate with neurology.  Recommend crossword puzzles at home and reading.  Will continue Aricept to 10 MG and Namenda as ordered by neurology.  Return in 6 months.      Relevant Medications   memantine (NAMENDA) 5 MG tablet   Benign essential tremor    Ongoing for several years.  Has improved with Propranolol, suspect more essential tremor. Monitor HR with Propranolol on board, as has trended down.  May need to adjust medications future visit.  At this time is tolerating well and has improvement in symptoms.        Other   B12 deficiency    Chronic, stable level recent labs.  Continue supplement at home and adjust as needed.      Relevant Orders   CBC with Differential/Platelet   Vitamin B12   Hyperlipidemia    Chronic, ongoing.  Continue current medication regimen and  adjust as needed.  Lipid panel today.      Relevant Orders   Comprehensive metabolic panel   Lipid Panel w/o Chol/HDL Ratio     Follow up plan: Return in about 6 months (around 02/11/2022) for DEMENTIA, HTN/HLD, TREMOR, GERD.

## 2021-08-15 LAB — CBC WITH DIFFERENTIAL/PLATELET
Basophils Absolute: 0.1 10*3/uL (ref 0.0–0.2)
Basos: 1 %
EOS (ABSOLUTE): 0.2 10*3/uL (ref 0.0–0.4)
Eos: 3 %
Hematocrit: 38.3 % (ref 37.5–51.0)
Hemoglobin: 12.8 g/dL — ABNORMAL LOW (ref 13.0–17.7)
Immature Grans (Abs): 0 10*3/uL (ref 0.0–0.1)
Immature Granulocytes: 0 %
Lymphocytes Absolute: 1.1 10*3/uL (ref 0.7–3.1)
Lymphs: 20 %
MCH: 30.3 pg (ref 26.6–33.0)
MCHC: 33.4 g/dL (ref 31.5–35.7)
MCV: 91 fL (ref 79–97)
Monocytes Absolute: 0.7 10*3/uL (ref 0.1–0.9)
Monocytes: 12 %
Neutrophils Absolute: 3.5 10*3/uL (ref 1.4–7.0)
Neutrophils: 64 %
Platelets: 338 10*3/uL (ref 150–450)
RBC: 4.23 x10E6/uL (ref 4.14–5.80)
RDW: 13.3 % (ref 11.6–15.4)
WBC: 5.4 10*3/uL (ref 3.4–10.8)

## 2021-08-15 LAB — COMPREHENSIVE METABOLIC PANEL
ALT: 20 IU/L (ref 0–44)
AST: 25 IU/L (ref 0–40)
Albumin/Globulin Ratio: 1.8 (ref 1.2–2.2)
Albumin: 4.6 g/dL (ref 3.7–4.7)
Alkaline Phosphatase: 106 IU/L (ref 44–121)
BUN/Creatinine Ratio: 17 (ref 10–24)
BUN: 18 mg/dL (ref 8–27)
Bilirubin Total: 0.5 mg/dL (ref 0.0–1.2)
CO2: 24 mmol/L (ref 20–29)
Calcium: 10 mg/dL (ref 8.6–10.2)
Chloride: 105 mmol/L (ref 96–106)
Creatinine, Ser: 1.08 mg/dL (ref 0.76–1.27)
Globulin, Total: 2.5 g/dL (ref 1.5–4.5)
Glucose: 100 mg/dL — ABNORMAL HIGH (ref 70–99)
Potassium: 4.4 mmol/L (ref 3.5–5.2)
Sodium: 144 mmol/L (ref 134–144)
Total Protein: 7.1 g/dL (ref 6.0–8.5)
eGFR: 72 mL/min/{1.73_m2} (ref >59)

## 2021-08-15 LAB — LIPID PANEL W/O CHOL/HDL RATIO
Cholesterol, Total: 120 mg/dL (ref 100–199)
HDL: 42 mg/dL (ref >39)
LDL Chol Calc (NIH): 59 mg/dL (ref 0–99)
Triglycerides: 98 mg/dL (ref 0–149)
VLDL Cholesterol Cal: 19 mg/dL (ref 5–40)

## 2021-08-15 LAB — VITAMIN B12: Vitamin B-12: 1625 pg/mL — ABNORMAL HIGH (ref 232–1245)

## 2021-08-15 NOTE — Progress Notes (Signed)
Good morning crew, please let Michael Zuniga and his wife know labs have returned: - Kidney function, creatinine and eGFR, remains normal, as is liver function, AST and ALT.   - B12 level improving, you can start taking supplement every other day and not every day. - Blood counts show slightly lower hemoglobin -- we will recheck this next visit and ensure no anemia or blood loss present.  Any questions? Keep being wonderful!!  Thank you for allowing me to participate in your care.  I appreciate you. Kindest regards, Tannar Broker

## 2021-11-09 DIAGNOSIS — E538 Deficiency of other specified B group vitamins: Secondary | ICD-10-CM | POA: Diagnosis not present

## 2021-11-09 DIAGNOSIS — R413 Other amnesia: Secondary | ICD-10-CM | POA: Diagnosis not present

## 2021-11-09 DIAGNOSIS — R9089 Other abnormal findings on diagnostic imaging of central nervous system: Secondary | ICD-10-CM | POA: Diagnosis not present

## 2021-11-09 DIAGNOSIS — Z789 Other specified health status: Secondary | ICD-10-CM | POA: Diagnosis not present

## 2021-11-14 ENCOUNTER — Other Ambulatory Visit: Payer: Self-pay | Admitting: Nurse Practitioner

## 2021-11-16 NOTE — Telephone Encounter (Signed)
Requested Prescriptions  Pending Prescriptions Disp Refills  . amLODipine (NORVASC) 10 MG tablet [Pharmacy Med Name: AMLODIPINE BESYLATE 10 MG TAB] 90 tablet 0    Sig: TAKE 1 TABLET (10 MG TOTAL) BY MOUTH DAILY     Cardiovascular: Calcium Channel Blockers 2 Passed - 11/14/2021  9:02 AM      Passed - Last BP in normal range    BP Readings from Last 1 Encounters:  08/14/21 113/65         Passed - Last Heart Rate in normal range    Pulse Readings from Last 1 Encounters:  08/14/21 (!) 58         Passed - Valid encounter within last 6 months    Recent Outpatient Visits          3 months ago Moderate late onset Alzheimer's dementia without behavioral disturbance, psychotic disturbance, mood disturbance, or anxiety ([place])   [place], Jolene T, NP   6 months ago Moderate dementia without behavioral disturbance, psychotic disturbance, mood disturbance, or anxiety, unspecified dementia type   [facility] [place], Jolene T, NP   7 months ago Dementia without behavioral disturbance, unspecified dementia type   [place] Cannady, Barbaraann Faster, NP   9 months ago Memory changes   [place] Cannady, Barbaraann Faster, NP   10 months ago Memory changes   [place], Barbaraann Faster, NP      Future Appointments            In 2 months Cannady, Barbaraann Faster, NP [employer], [facility]

## 2021-12-09 ENCOUNTER — Other Ambulatory Visit: Payer: Self-pay | Admitting: Nurse Practitioner

## 2021-12-09 NOTE — Telephone Encounter (Signed)
Requested medication (s) are due for refill today: expired medication  Requested medication (s) are on the active medication list: yes  Last refill:  11/03/20 #90 4 refills  Future visit scheduled: yes in 2 months  Notes to clinic:  expired medication. Do you want to renew Rx?     Requested Prescriptions  Pending Prescriptions Disp Refills   atorvastatin (LIPITOR) 40 MG tablet [Pharmacy Med Name: ATORVASTATIN 40 MG TABLET] 90 tablet 3    Sig: TAKE 1 TABLET (40 MG TOTAL)BY MOUTH DAILY.     Cardiovascular:  Antilipid - Statins Failed - 12/09/2021  1:49 AM      Failed - Lipid Panel in normal range within the last 12 months    Cholesterol, Total  Date Value Ref Range Status  08/14/2021 120 100 - 199 mg/dL Final   Cholesterol Piccolo, Waived  Date Value Ref Range Status  04/19/2016 128 <200 mg/dL Final    Comment:                            Desirable                <200                         Borderline High      200- 239                         High                     >239    LDL Chol Calc (NIH)  Date Value Ref Range Status  08/14/2021 59 0 - 99 mg/dL Final   HDL  Date Value Ref Range Status  08/14/2021 42 >39 mg/dL Final   Triglycerides  Date Value Ref Range Status  08/14/2021 98 0 - 149 mg/dL Final   Triglycerides Piccolo,Waived  Date Value Ref Range Status  04/19/2016 114 <150 mg/dL Final    Comment:                            Normal                   <150                         Borderline High     150 - 199                         High                200 - 499                         Very High                >499          Passed - Patient is not pregnant      Passed - Valid encounter within last 12 months    Recent Outpatient Visits           3 months ago Moderate late onset Alzheimer's dementia without behavioral disturbance, psychotic disturbance, mood disturbance, or anxiety ([place])   [place], [place] T, NP   7 months ago  Moderate dementia without behavioral disturbance, psychotic disturbance, mood disturbance, or anxiety, unspecified dementia type   [facility] [place], Jolene T, NP   8 months ago Dementia without behavioral disturbance, unspecified dementia type   [place], Barbaraann Faster, NP   10 months ago Memory changes   [place] Cannady, Barbaraann Faster, NP   11 months ago Memory changes   [place], Barbaraann Faster, NP       Future Appointments             In 2 months Cannady, Barbaraann Faster, NP [employer], [facility]

## 2021-12-10 ENCOUNTER — Other Ambulatory Visit: Payer: Self-pay | Admitting: Nurse Practitioner

## 2021-12-10 NOTE — Telephone Encounter (Signed)
Requested Prescriptions  Pending Prescriptions Disp Refills  . propranolol (INDERAL) 20 MG tablet [Pharmacy Med Name: PROPRANOLOL 20 MG TABLET] 90 tablet 0    Sig: TAKE 1 TABLET (20 MG TOTAL) BY MOUTH DAILY.     Cardiovascular:  Beta Blockers Passed - 12/10/2021  1:55 AM      Passed - Last BP in normal range    BP Readings from Last 1 Encounters:  08/14/21 113/65         Passed - Last Heart Rate in normal range    Pulse Readings from Last 1 Encounters:  08/14/21 (!) 58         Passed - Valid encounter within last 6 months    Recent Outpatient Visits          3 months ago Moderate late onset Alzheimer's dementia without behavioral disturbance, psychotic disturbance, mood disturbance, or anxiety ([place])   [place], Jolene T, NP   7 months ago Moderate dementia without behavioral disturbance, psychotic disturbance, mood disturbance, or anxiety, unspecified dementia type   [facility] [place], Jolene T, NP   8 months ago Dementia without behavioral disturbance, unspecified dementia type   [place], Barbaraann Faster, NP   10 months ago Memory changes   [place] Cannady, Barbaraann Faster, NP   11 months ago Memory changes   [place], Barbaraann Faster, NP      Future Appointments            In 2 months Cannady, Barbaraann Faster, NP [employer], [facility]

## 2021-12-24 ENCOUNTER — Telehealth: Payer: Self-pay

## 2021-12-24 ENCOUNTER — Ambulatory Visit: Payer: Self-pay

## 2021-12-24 ENCOUNTER — Other Ambulatory Visit: Payer: Self-pay

## 2021-12-24 MED ORDER — TRAZODONE HCL 50 MG PO TABS: 25.0000 mg | ORAL_TABLET | Freq: Every evening | ORAL | 3 refills | Status: DC | PRN

## 2021-12-24 NOTE — Telephone Encounter (Signed)
Summary: Pt's wife passed away, has medicine needs   Pt's wife just passed away last night, pt's daughter called reporting that he is unsure of his medicine needs because she handled it all for him. Apparently he has been unable to sleep.   Please call daughter Warren Lacy [phone]     Called [place]

## 2021-12-24 NOTE — Telephone Encounter (Signed)
Copied from [place] [phone]. Topic: General - Other >> Dec 24, 2021  4:10 PM [place] B wrote: Reason for [id number] daughter called back checking status if patient will lbe sent meds, because he can't sleep since his wife passed, explained to patient Dr Ned Card is out for today. Please call back

## 2021-12-24 NOTE — Telephone Encounter (Signed)
Summary: Pt's wife passed away, has medicine needs   Pt's wife just passed away last night, pt's daughter called reporting that he is unsure of his medicine needs because she handled it all for him. Apparently he has been unable to sleep.   Please call daughter Warren Lacy [phone]       Called daughter  - [facility]

## 2021-12-24 NOTE — Telephone Encounter (Signed)
Summary: Pt's wife passed away, has medicine needs   Pt's wife just passed away last night, pt's daughter called reporting that he is unsure of his medicine needs because she handled it all for him. Apparently he has been unable to sleep.   Please call daughter Warren Lacy [phone]     Called Amy - LMOM to return or call.

## 2021-12-24 NOTE — Addendum Note (Signed)
Addended by: Marnee Guarneri T on: 12/24/2021 04:17 PM   Modules accepted: Orders

## 2021-12-25 ENCOUNTER — Telehealth: Payer: Self-pay

## 2021-12-25 NOTE — Chronic Care Management (AMB) (Signed)
  Chronic Care Management Note  12/25/2021 Name: Michael Zuniga MRN: [id number] DOB: [birth year]  Michael Zuniga is a 71 y.o. year old male who is a primary care patient of Venita Lick, NP and is actively engaged with the care management team. I reached out to [name] by phone today to assist with scheduling a follow up visit with the RN Case Manager  Follow up plan: Unsuccessful telephone outreach attempt made. A HIPAA compliant phone message was left for the patient providing contact information and requesting a return call.  The care management team will reach out to the patient again over the next 3 days.  If patient returns call to provider office, please advise to call [place]  at [place], [place], [place],  [address] Direct Dial: [phone] [name] Website: [email or web address]

## 2021-12-28 NOTE — Telephone Encounter (Signed)
Left a detailed message for the family of the patient with [name] message and recommendations. Advised family to give our office a call back in regards to message if they have any questions or concerns.   OK for [facility] to give note if patient family calls back.

## 2021-12-30 NOTE — Chronic Care Management (AMB) (Signed)
  Chronic Care Management Note  12/30/2021 Name: Michael Zuniga MRN: [id number] DOB: [birth year]  Michael Zuniga is a 71 y.o. year old male who is a primary care patient of Venita Lick, NP and is actively engaged with the care management team. I reached out to [name] by phone today to assist with scheduling a follow up visit with the RN Case Manager and Licensed Clinical Social Worker  Follow up plan: Unsuccessful telephone outreach attempt made. A HIPAA compliant phone message was left for the patient providing contact information and requesting a return call.  The care management team will reach out to the patient again over the next 5 days.  If patient returns call to provider office, please advise to call [place]  at [place], [place], [place], [place] [address] Direct Dial: [phone] [name] Website: [email or web address]

## 2022-01-05 ENCOUNTER — Telehealth: Payer: Self-pay | Admitting: Nurse Practitioner

## 2022-01-05 NOTE — Telephone Encounter (Signed)
Mandy from [facility] called to request an updated med list and notes from PCP. She says fax with the attn to [facility]. Pt has officially switched his pharmacy and wants all Rxs via:  [place], [place].  [place] [address]  Phone: [phone] Fax: [phone]

## 2022-01-05 NOTE — Chronic Care Management (AMB) (Signed)
  Chronic Care Management Note  01/05/2022 Name: Michael Zuniga MRN: [id number] DOB: Feb 21, 1951  Michael Zuniga is a 71 y.o. year old male who is a primary care patient of Venita Lick, NP and is actively engaged with the care management team. I reached out to Michael Zuniga by phone today to assist with scheduling a follow up visit with the RN Case Manager  Follow up plan: Unable to make contact on outreach attempts x 3. PCP Venita Lick, NP notified via routed documentation in medical record.   Noreene Larsson, [place], [place] [address] Direct Dial: [phone] [email or web address]

## 2022-01-06 ENCOUNTER — Other Ambulatory Visit: Payer: Self-pay

## 2022-01-06 MED ORDER — SILDENAFIL CITRATE 20 MG PO TABS: ORAL_TABLET | ORAL | 2 refills | Status: DC

## 2022-01-06 MED ORDER — DONEPEZIL HCL 10 MG PO TABS: 10.0000 mg | ORAL_TABLET | Freq: Every day | ORAL | 4 refills | Status: DC

## 2022-01-06 MED ORDER — AMLODIPINE BESYLATE 10 MG PO TABS: 10.0000 mg | ORAL_TABLET | Freq: Every day | ORAL | 4 refills | Status: DC

## 2022-01-06 MED ORDER — OMEPRAZOLE 20 MG PO CPDR: 20.0000 mg | DELAYED_RELEASE_CAPSULE | Freq: Every day | ORAL | 4 refills | Status: DC

## 2022-01-06 MED ORDER — TRAZODONE HCL 50 MG PO TABS: 25.0000 mg | ORAL_TABLET | Freq: Every evening | ORAL | 3 refills | Status: DC | PRN

## 2022-01-06 MED ORDER — LOSARTAN POTASSIUM 50 MG PO TABS: 50.0000 mg | ORAL_TABLET | Freq: Every day | ORAL | 4 refills | Status: DC

## 2022-01-06 MED ORDER — PROPRANOLOL HCL 20 MG PO TABS: 20.0000 mg | ORAL_TABLET | Freq: Every day | ORAL | 4 refills | Status: DC

## 2022-01-06 MED ORDER — ATORVASTATIN CALCIUM 40 MG PO TABS: ORAL_TABLET | ORAL | 4 refills | Status: DC

## 2022-01-06 NOTE — Telephone Encounter (Signed)
Current medication list faxed over to [facility] ar [id number]. All patient's medications sent by provider Jolene was sent over to patient requested pharmacy [facility].

## 2022-01-13 ENCOUNTER — Ambulatory Visit: Payer: Medicare Other

## 2022-01-14 ENCOUNTER — Telehealth: Payer: Self-pay | Admitting: Licensed Clinical Social Worker

## 2022-01-14 NOTE — Telephone Encounter (Signed)
    Clinical Social Work  Care Management   Phone Outreach    01/14/2022 Name: JAVARES KAUFHOLD MRN: [id number] DOB: 1950/11/19  Madie Reno Benney is a 71 y.o. year old male who is a primary care patient of Cannady, Barbaraann Faster, NP .   Reason for referral: Grief Counseling.    CCM LCSW reached out to patient's caregiver today by phone to introduce self, assess needs and offer Care Management services and interventions.    Telephone outreach was unsuccessful. A HIPPA compliant phone message was left for the patient providing contact information and requesting a return call.   Plan: LCSW will await a return call and collaborate with PCP about completing a referral for services, if the family is interested in services  Review of patient status, including review of consultants reports, relevant laboratory and other test results, and collaboration with appropriate care team members and the patient's provider was performed as part of comprehensive patient evaluation and provision of care management services.    Christa See, MSW, [email or web address] Phone [phone] 1:11 PM

## 2022-01-17 ENCOUNTER — Other Ambulatory Visit: Payer: Self-pay | Admitting: Nurse Practitioner

## 2022-01-19 NOTE — Telephone Encounter (Signed)
Requested medication (s) are due for refill today: no  Requested medication (s) are on the active medication list:yes  Last refill:  01/06/22  Future visit scheduled: yes  Notes to clinic:  Unable to refill per protocol, Rx was refilled 01/06/22 for 90 days and 3 refills. Possible duplicate       Requested Prescriptions  Pending Prescriptions Disp Refills   losartan (COZAAR) 50 MG tablet [Pharmacy Med Name: LOSARTAN POTASSIUM 50 MG TAB] 90 tablet 3    Sig: Take 1 tablet (50 mg total) by mouth daily.     Cardiovascular:  Angiotensin Receptor Blockers Passed - 01/17/2022  9:27 AM      Passed - Cr in normal range and within 180 days    Creatinine, Ser  Date Value Ref Range Status  08/14/2021 1.08 0.76 - 1.27 mg/dL Final         Passed - K in normal range and within 180 days    Potassium  Date Value Ref Range Status  08/14/2021 4.4 3.5 - 5.2 mmol/L Final         Passed - Patient is not pregnant      Passed - Last BP in normal range    BP Readings from Last 1 Encounters:  08/14/21 113/65         Passed - Valid encounter within last 6 months    Recent Outpatient Visits           5 months ago Moderate late onset Alzheimer's dementia without behavioral disturbance, psychotic disturbance, mood disturbance, or anxiety ([place])   [place], Jolene T, NP   8 months ago Moderate dementia without behavioral disturbance, psychotic disturbance, mood disturbance, or anxiety, unspecified dementia type   [facility] [place], Jolene T, NP   10 months ago Dementia without behavioral disturbance, unspecified dementia type   [place] Cannady, Barbaraann Faster, NP   11 months ago Memory changes   [place], [place] T, NP   1 year ago Memory changes   [place], Barbaraann Faster, NP       Future Appointments             In 3 weeks Cannady, Barbaraann Faster, NP [employer]

## 2022-02-12 ENCOUNTER — Ambulatory Visit: Payer: Medicare Other | Admitting: Nurse Practitioner

## 2022-02-12 DIAGNOSIS — E538 Deficiency of other specified B group vitamins: Secondary | ICD-10-CM

## 2022-02-12 DIAGNOSIS — G250 Essential tremor: Secondary | ICD-10-CM

## 2022-02-12 DIAGNOSIS — F02B0 Dementia in other diseases classified elsewhere, moderate, without behavioral disturbance, psychotic disturbance, mood disturbance, and anxiety: Secondary | ICD-10-CM

## 2022-02-12 DIAGNOSIS — K2100 Gastro-esophageal reflux disease with esophagitis, without bleeding: Secondary | ICD-10-CM

## 2022-02-12 DIAGNOSIS — I2510 Atherosclerotic heart disease of native coronary artery without angina pectoris: Secondary | ICD-10-CM

## 2022-02-12 DIAGNOSIS — I10 Essential (primary) hypertension: Secondary | ICD-10-CM

## 2022-02-12 DIAGNOSIS — E7800 Pure hypercholesterolemia, unspecified: Secondary | ICD-10-CM

## 2022-02-23 ENCOUNTER — Ambulatory Visit: Payer: Medicare Other

## 2022-03-20 ENCOUNTER — Other Ambulatory Visit: Payer: Self-pay | Admitting: Nurse Practitioner

## 2022-03-22 NOTE — Telephone Encounter (Signed)
Unable to refill per protocol, Rx reqquest is too soon. Last RF 01/06/22 for 90 and 4 RF.E-Prescribing Status: Receipt confirmed by pharmacy (01/06/2022 10:27). Will refuse.  Requested Prescriptions  Pending Prescriptions Disp Refills  . propranolol (INDERAL) 20 MG tablet [Pharmacy Med Name: PROPRANOLOL 20 MG TABLET] 90 tablet 4    Sig: TAKE 1 TABLET BY MOUTH EVERY DAY     Cardiovascular:  Beta Blockers Failed - 03/20/2022  9:26 AM      Failed - Valid encounter within last 6 months    Recent Outpatient Visits          7 months ago Moderate late onset Alzheimer's dementia without behavioral disturbance, psychotic disturbance, mood disturbance, or anxiety ([place])   [place] Cannady, Jolene T, NP   10 months ago Moderate dementia without behavioral disturbance, psychotic disturbance, mood disturbance, or anxiety, unspecified dementia type   [facility] [place], Jolene T, NP   12 months ago Dementia without behavioral disturbance, unspecified dementia type   [place], Barbaraann Faster, NP   1 year ago Memory changes   [place], [place] T, NP   1 year ago Memory changes   [place], [place] T, NP             Passed - Last BP in normal range    BP Readings from Last 1 Encounters:  08/14/21 113/65         Passed - Last Heart Rate in normal range    Pulse Readings from Last 1 Encounters:  08/14/21 (!) 58

## 2022-10-07 IMAGING — MR MR HEAD WO/W CM
14 series · 48 of 48 positions shown · IV contrast (gadavist)
Comparison: None.

CLINICAL DATA: Memory loss for 1 year, worse over the past 6
months.

EXAM:
MRI HEAD WITHOUT AND WITH CONTRAST
TECHNIQUE: Multiplanar, multiecho pulse sequences of the brain and surrounding
structures were obtained without and with intravenous contrast.
CONTRAST:  7.5mL GADAVIST GADOBUTROL 1 MMOL/ML IV SOLN

[Series 5: ax dwi_tracew · axial · 3.0mm · 0.65mm/px · z∈[-66,+88]mm · 4 of 48 slices shown]
[im 1/48]
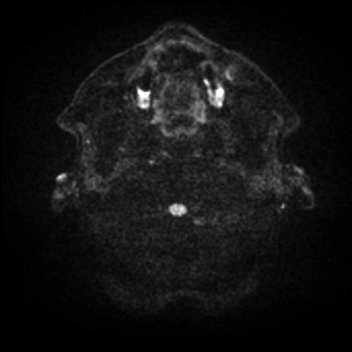
[im 16/48]
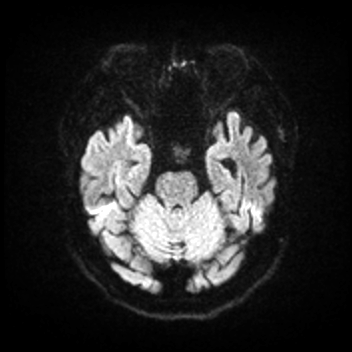
[im 32/48]
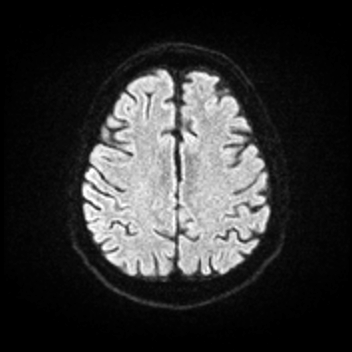
[im 48/48]
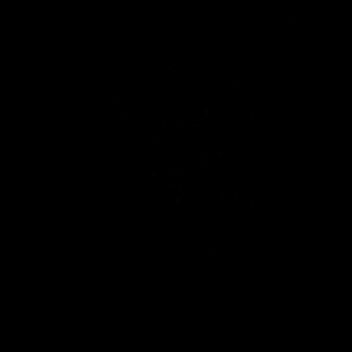

[Series 6: ax dwi_adc · axial · 3.0mm · 0.65mm/px · z∈[-66,+85]mm · 3 of 47 slices shown]
[im 1/47]
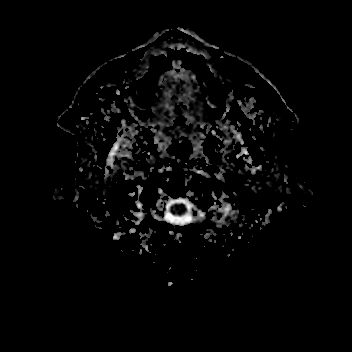
[im 24/47]
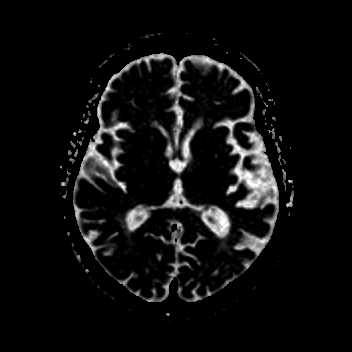
[im 47/47]
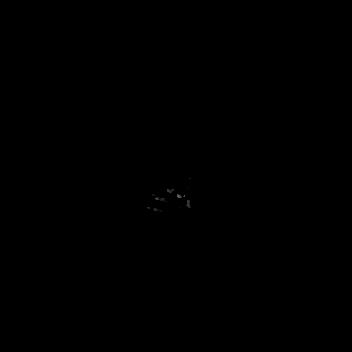

[Series 7: cor dwi_tracew · coronal · 5.0mm · 0.65mm/px · 2 of 32 slices shown]
[im 1/32]
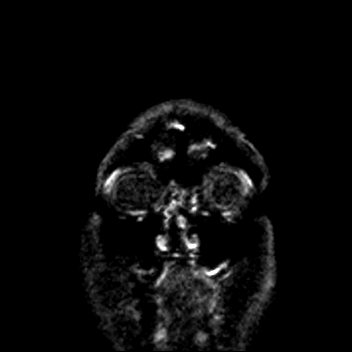
[im 32/32]
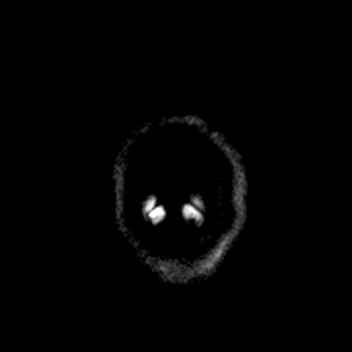

[Series 8: cor dwi_adc · coronal · 5.0mm · 0.65mm/px · 2 of 32 slices shown]
[im 1/32]
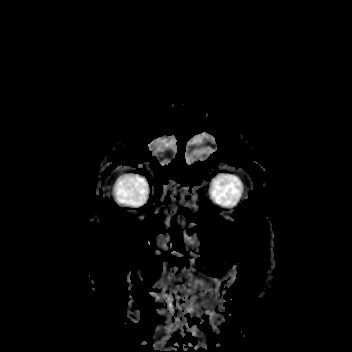
[im 32/32]
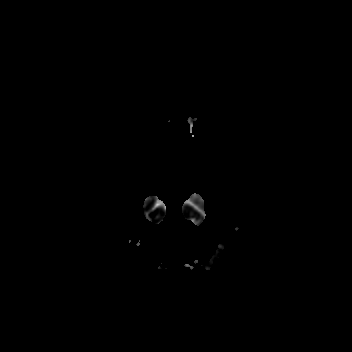

[Series 9: T1 · sagittal · 5.0mm · 0.62mm/px · 1 of 21 slices shown (1 of 2)]
[im 1/21]
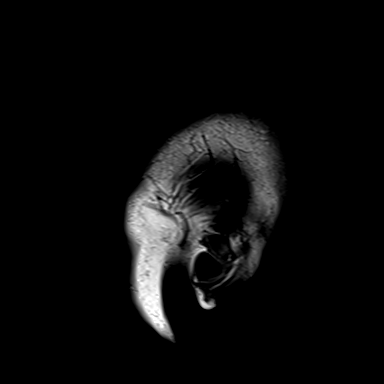

[Series 10: T2 · axial · 5.0mm · 0.53mm/px · z∈[-61,+83]mm · 2 of 25 slices shown]
[im 1/25]
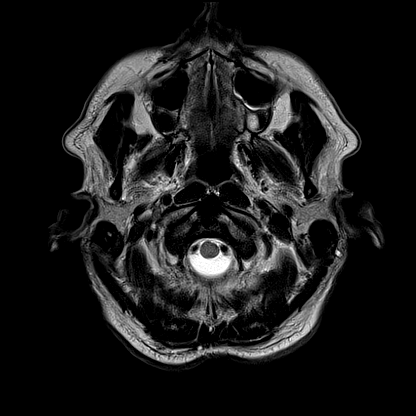
[im 25/25]
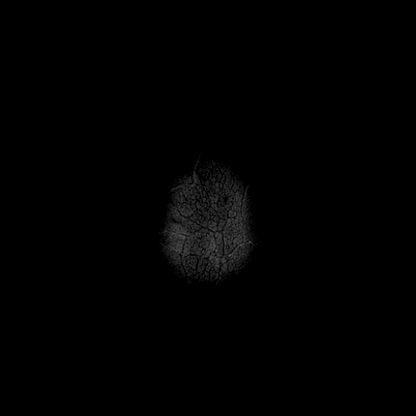

[Series 11: mag_images · axial · 3.0mm · 0.90mm/px · z∈[-66,+87]mm · 3 of 52 slices shown]
[im 1/52]
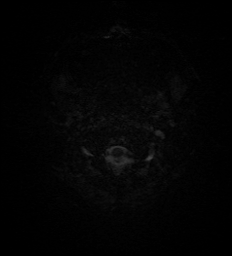
[im 26/52]
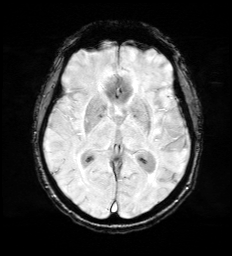
[im 52/52]
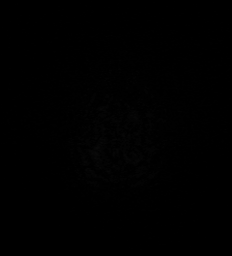

[Series 12: pha_images · axial · 3.0mm · 0.90mm/px · z∈[-66,+87]mm · 3 of 52 slices shown]
[im 1/52]
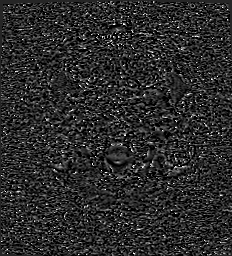
[im 26/52]
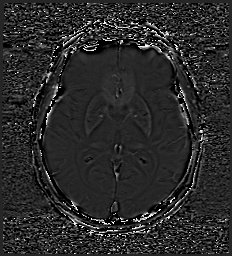
[im 52/52]
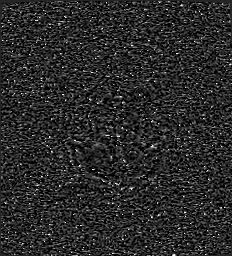

[Series 13: swi_images · axial · 3.0mm · 0.90mm/px · z∈[-66,+87]mm · 3 of 52 slices shown]
[im 1/52]
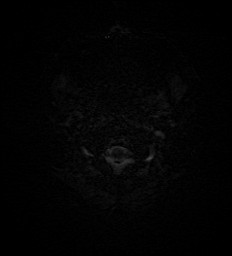
[im 26/52]
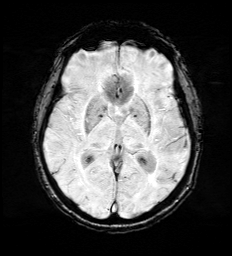
[im 52/52]
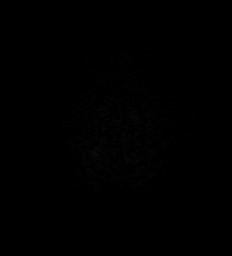

[Series 15: FLAIR · axial · 3.0mm · 0.53mm/px · z∈[-70,+92]mm · 3 of 55 slices shown]
[im 1/55]
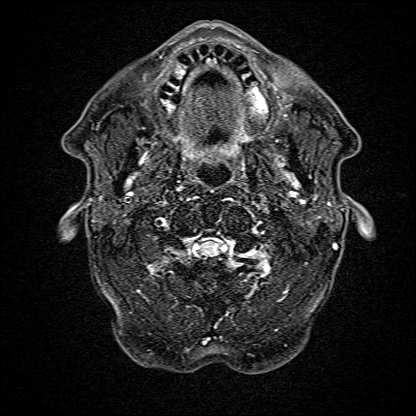
[im 28/55]
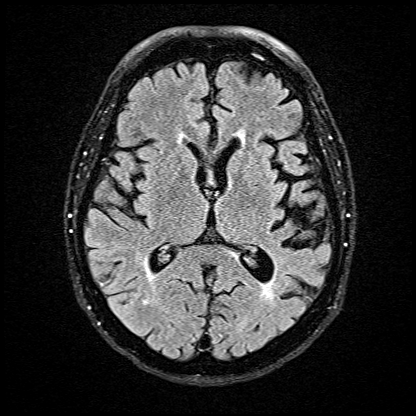
[im 55/55]
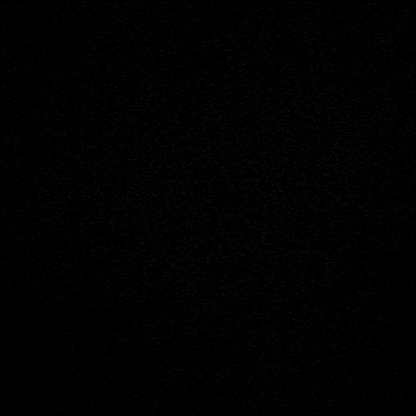

[Series 16: T1 · axial · 1.0mm · 0.98mm/px · z∈[-60,+83]mm · 9 of 144 slices shown (2 of 2)]
[im 1/144]
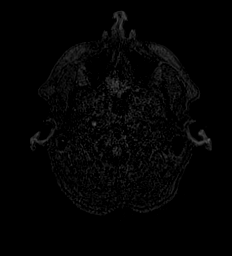
[im 18/144]
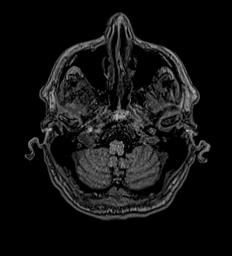
[im 36/144]
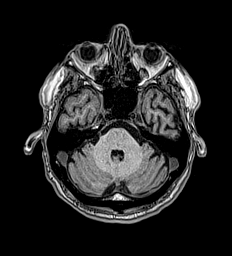
[im 54/144]
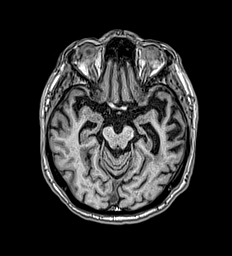
[im 72/144]
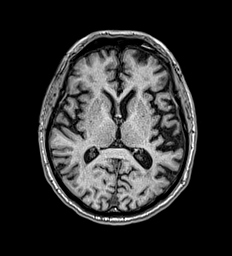
[im 90/144]
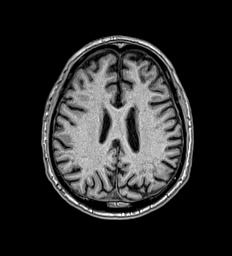
[im 108/144]
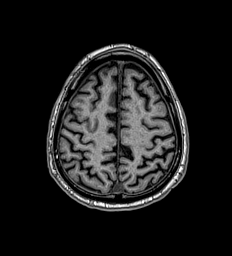
[im 126/144]
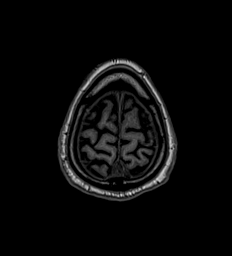
[im 144/144]
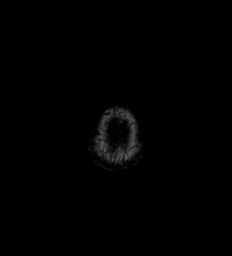

[Series 17: T2 post-contrast · coronal · 5.0mm · 0.57mm/px · 2 of 29 slices shown]
[im 1/29]
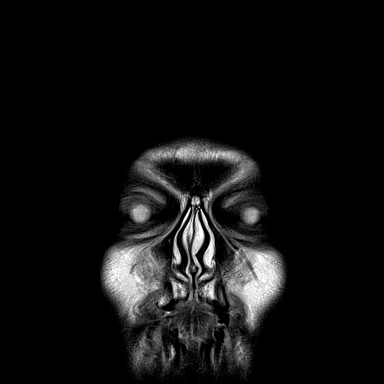
[im 29/29]
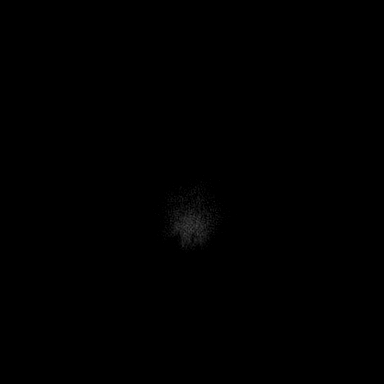

[Series 18: T1 post-contrast · axial · 1.0mm · 0.98mm/px · z∈[-60,+83]mm · 9 of 144 slices shown (1 of 2)]
[im 1/144]
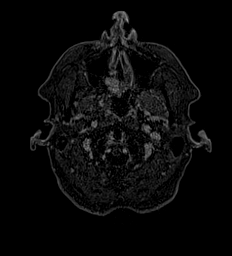
[im 18/144]
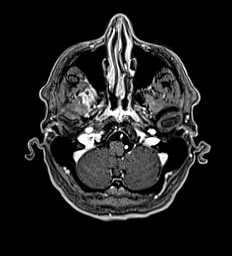
[im 36/144]
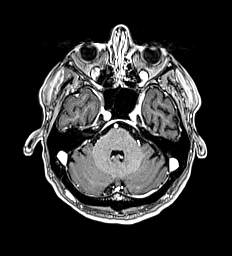
[im 54/144]
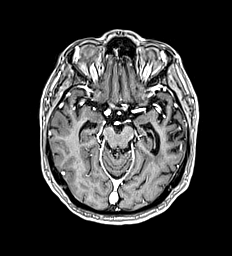
[im 72/144]
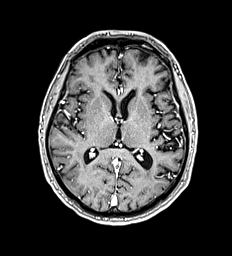
[im 90/144]
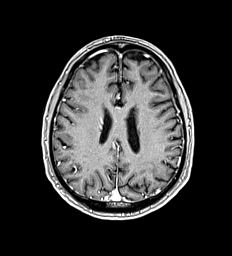
[im 108/144]
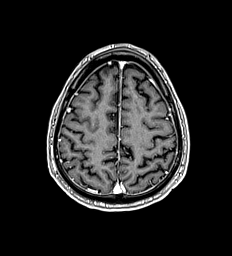
[im 126/144]
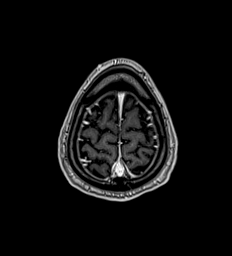
[im 144/144]
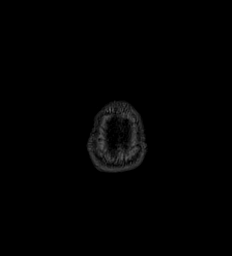

[Series 19: T1 post-contrast · coronal · 5.0mm · 0.57mm/px · 2 of 29 slices shown (2 of 2)]
[im 1/29]
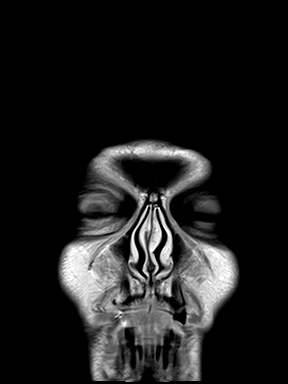
[im 29/29]
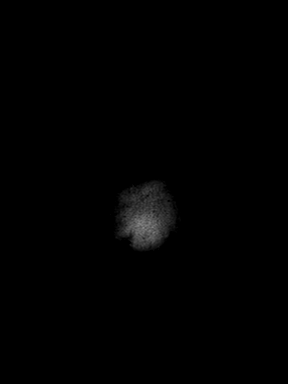

[48 of 48 positions shown; findings below may reference images not displayed]

FINDINGS: Brain: Brain atrophy most notably affecting the temporal lobes with
greater involvement on the left. No hydrocephalus, mass, collection,
or cortical infarcts. Mild for age chronic small vessel ischemia in
the cerebral white matter. No abnormal enhancement.

Vascular: Normal flow voids.  Normal vessel enhancement

Skull and upper cervical spine: Normal marrow signal

Sinuses/Orbits: No contributory finding.
IMPRESSION: 1. Cerebral volume loss most notably affecting the temporal lobes,
especially on the left.
2. No reversible finding.

## 2022-10-11 DIAGNOSIS — Z789 Other specified health status: Secondary | ICD-10-CM | POA: Diagnosis not present

## 2022-10-11 DIAGNOSIS — R9089 Other abnormal findings on diagnostic imaging of central nervous system: Secondary | ICD-10-CM | POA: Diagnosis not present

## 2022-10-11 DIAGNOSIS — E538 Deficiency of other specified B group vitamins: Secondary | ICD-10-CM | POA: Diagnosis not present

## 2022-10-11 DIAGNOSIS — R413 Other amnesia: Secondary | ICD-10-CM | POA: Diagnosis not present

## 2022-11-05 ENCOUNTER — Telehealth: Payer: Self-pay | Admitting: Nurse Practitioner

## 2022-11-05 NOTE — Telephone Encounter (Signed)
Copied from CRM [id number]. Topic: Medicare AWV >> Nov 05, 2022  9:48 AM Payton Doughty wrote: Reason for CRM: Called patient to schedule Medicare Annual Wellness Visit (AWV). Left message for patient to call back and schedule Medicare Annual Wellness Visit (AWV).  Last date of AWV: none  Please schedule an appointment at any time with Kennedy Bucker, LPN  .  If any questions, please contact me.  Thank you ,  Verlee Rossetti; Care Guide Ambulatory Clinical Support [facility] Direct Dial: [phone]

## 2022-12-29 ENCOUNTER — Telehealth: Payer: Self-pay | Admitting: Nurse Practitioner

## 2022-12-29 NOTE — Telephone Encounter (Signed)
Copied from CRM [id number]. Topic: Medicare AWV >> Dec 29, 2022  9:33 AM Payton Doughty wrote: Reason for CRM: LM 12/29/2022 to schedule AWV   Verlee Rossetti; Care Guide Ambulatory Clinical Support [place] [facility] Direct Dial: [phone]

## 2023-01-17 ENCOUNTER — Other Ambulatory Visit: Payer: Self-pay | Admitting: Nurse Practitioner

## 2023-01-19 ENCOUNTER — Other Ambulatory Visit: Payer: Self-pay | Admitting: Nurse Practitioner

## 2023-01-19 NOTE — Telephone Encounter (Signed)
Unable to refill per protocol, appointment needed.   Requested Prescriptions  Pending Prescriptions Disp Refills   omeprazole (PRILOSEC) 20 MG capsule [Pharmacy Med Name: OMEPRAZOLE DR 20 MG CAP] 90 capsule 4    Sig: TAKE 1 CAPSULE BY MOUTH ONCE DAILY     Gastroenterology: Proton Pump Inhibitors Failed - 01/17/2023  4:51 PM      Failed - Valid encounter within last 12 months    Recent Outpatient Visits           1 year ago Moderate late onset Alzheimer's dementia without behavioral disturbance, psychotic disturbance, mood disturbance, or anxiety (HCC)   [place] [facility] [place], [place] T, NP   1 year ago Moderate dementia without behavioral disturbance, psychotic disturbance, mood disturbance, or anxiety, unspecified dementia type   [place] [facility] [place], Corrie Dandy T, NP   1 year ago Dementia without behavioral disturbance, unspecified dementia type   [place] [facility] [place], Corrie Dandy T, NP   1 year ago Memory changes   [place] [facility] [place], [place] T, NP   2 years ago Memory changes   [place] [facility] [place], [place] T, NP               losartan (COZAAR) 50 MG tablet [Pharmacy Med Name: LOSARTAN POTASSIUM 50 MG TAB] 90 tablet 4    Sig: TAKE 1 TABLET BY MOUTH ONCE DAILY     Cardiovascular:  Angiotensin Receptor Blockers Failed - 01/17/2023  4:51 PM      Failed - Cr in normal range and within 180 days    Creatinine, Ser  Date Value Ref Range Status  08/14/2021 1.08 0.76 - 1.27 mg/dL Final         Failed - K in normal range and within 180 days    Potassium  Date Value Ref Range Status  08/14/2021 4.4 3.5 - 5.2 mmol/L Final         Failed - Valid encounter within last 6 months    Recent Outpatient Visits           1 year ago Moderate late onset Alzheimer's dementia without behavioral disturbance, psychotic disturbance, mood disturbance, or anxiety (HCC)   [place]  [facility] [place], [place] T, NP   1 year ago Moderate dementia without behavioral disturbance, psychotic disturbance, mood disturbance, or anxiety, unspecified dementia type   [place] [facility] [place], Corrie Dandy T, NP   1 year ago Dementia without behavioral disturbance, unspecified dementia type   [place] [facility] [place], Dorie Rank, NP   1 year ago Memory changes   [place] [facility] [place], [place] T, NP   2 years ago Memory changes    [facility] [place], [place] T, NP              Passed - Patient is not pregnant      Passed - Last BP in normal range    BP Readings from Last 1 Encounters:  08/14/21 113/65          donepezil (ARICEPT) 10 MG tablet [Pharmacy Med Name: DONEPEZIL HCL 10 MG TAB] 90 tablet 4    Sig: TAKE 1 TABLET BY MOUTH AT BEDTIME     Neurology:  Alzheimer's Agents Failed - 01/17/2023  4:51 PM      Failed - Valid encounter within last 6 months    Recent Outpatient Visits  1 year ago Moderate late onset Alzheimer's dementia without behavioral disturbance, psychotic disturbance, mood disturbance, or anxiety (HCC)   [place] [facility] [place], [place] T, NP   1 year ago Moderate dementia without behavioral disturbance, psychotic disturbance, mood disturbance, or anxiety, unspecified dementia type   [place] [facility] [place], Corrie Dandy T, NP   1 year ago Dementia without behavioral disturbance, unspecified dementia type   [place] [facility] [place], Dorie Rank, NP   1 year ago Memory changes   [place] [facility] [place], Corrie Dandy T, NP   2 years ago Memory changes   [place] [facility] [place], [place] T, NP               atorvastatin (LIPITOR) 40 MG tablet [Pharmacy Med Name: ATORVASTATIN CALCIUM 40 MG TAB] 90 tablet 4    Sig: TAKE 1 TABLET BY MOUTH ONCE DAILY      Cardiovascular:  Antilipid - Statins Failed - 01/17/2023  4:51 PM      Failed - Valid encounter within last 12 months    Recent Outpatient Visits           1 year ago Moderate late onset Alzheimer's dementia without behavioral disturbance, psychotic disturbance, mood disturbance, or anxiety (HCC)   [place] [facility] [place], [place] T, NP   1 year ago Moderate dementia without behavioral disturbance, psychotic disturbance, mood disturbance, or anxiety, unspecified dementia type   [place] [facility] [place], Corrie Dandy T, NP   1 year ago Dementia without behavioral disturbance, unspecified dementia type    [facility] [place], Dorie Rank, NP   1 year ago Memory changes    [facility] [place], [place] T, NP   2 years ago Memory changes    [facility] [place], [place] T, NP              Failed - Lipid Panel in normal range within the last 12 months    Cholesterol, Total  Date Value Ref Range Status  08/14/2021 120 100 - 199 mg/dL Final   Cholesterol Piccolo, Waived  Date Value Ref Range Status  04/19/2016 128 <200 mg/dL Final    Comment:                            Desirable                <200                         Borderline High      200- 239                         High                     >239    LDL Chol Calc (NIH)  Date Value Ref Range Status  08/14/2021 59 0 - 99 mg/dL Final   HDL  Date Value Ref Range Status  08/14/2021 42 >39 mg/dL Final   Triglycerides  Date Value Ref Range Status  08/14/2021 98 0 - 149 mg/dL Final   Triglycerides Piccolo,Waived  Date Value Ref Range Status  04/19/2016 114 <150 mg/dL Final    Comment:  Normal                   <150                         Borderline High     150 - 199                         High                200 - 499                         Very High                >499          Passed -  Patient is not pregnant       amLODipine (NORVASC) 10 MG tablet [Pharmacy Med Name: AMLODIPINE BESYLATE 10 MG TAB] 90 tablet 4    Sig: TAKE 1 TABLET BY MOUTH ONCE DAILY     Cardiovascular: Calcium Channel Blockers 2 Failed - 01/17/2023  4:51 PM      Failed - Valid encounter within last 6 months    Recent Outpatient Visits           1 year ago Moderate late onset Alzheimer's dementia without behavioral disturbance, psychotic disturbance, mood disturbance, or anxiety (HCC)   [place] [facility] [place], [place] T, NP   1 year ago Moderate dementia without behavioral disturbance, psychotic disturbance, mood disturbance, or anxiety, unspecified dementia type   [place] [facility] [place], Corrie Dandy T, NP   1 year ago Dementia without behavioral disturbance, unspecified dementia type   [place] [facility] [place], Dorie Rank, NP   1 year ago Memory changes   [place] [facility] [place], [place] T, NP   2 years ago Memory changes   [place] [facility] [place], [place] T, NP              Passed - Last BP in normal range    BP Readings from Last 1 Encounters:  08/14/21 113/65         Passed - Last Heart Rate in normal range    Pulse Readings from Last 1 Encounters:  08/14/21 (!) 58

## 2023-01-20 ENCOUNTER — Telehealth: Payer: Self-pay | Admitting: Nurse Practitioner

## 2023-01-20 NOTE — Telephone Encounter (Signed)
Appointment has been made and reminder has been mailed.Called  and no answer could not leave voice mail.

## 2023-01-20 NOTE — Telephone Encounter (Signed)
Copied from CRM [id number]. Topic: Appointment Scheduling - Scheduling Inquiry for Clinic >> Jan 19, 2023 11:15 AM Phill Myron wrote: Patients daughter is requesting a call back, she would like to schedule a physical and would like a morning appt on a Monday for her father.  I am not showing any Monday morning dates.

## 2023-01-20 NOTE — Telephone Encounter (Signed)
Requested medications are due for refill today.  yes  Requested medications are on the active medications list.  yes  Last refill. All refilled 01/06/2022 #90 [id number] rf  Future visit scheduled.   yes  Notes to clinic.  Pt has an upcoming visit. Labs are expired. I would only be able to refill with enough medication to last until OV for some meds and others, I would not be able to refill at all. Please review.    Requested Prescriptions  Pending Prescriptions Disp Refills   omeprazole (PRILOSEC) 20 MG capsule [Pharmacy Med Name: OMEPRAZOLE DR 20 MG CAP] 90 capsule 4    Sig: TAKE 1 CAPSULE BY MOUTH ONCE DAILY     Gastroenterology: Proton Pump Inhibitors Failed - 01/19/2023  1:59 PM      Failed - Valid encounter within last 12 months    Recent Outpatient Visits           1 year ago Moderate late onset Alzheimer's dementia without behavioral disturbance, psychotic disturbance, mood disturbance, or anxiety (HCC)   [place] [facility] [place], Weston T, NP   1 year ago Moderate dementia without behavioral disturbance, psychotic disturbance, mood disturbance, or anxiety, unspecified dementia type   [place] [facility] [place], Corrie Dandy T, NP   1 year ago Dementia without behavioral disturbance, unspecified dementia type   [place] [facility] [place], Corrie Dandy T, NP   1 year ago Memory changes   [place] [facility] [place], [place] T, NP   2 years ago Memory changes   [place] [facility] [place], [place]ald[place] T, NP       Future Appointments             In 1 week Mecum, Oswaldo Conroy, PA-C [place] [facility]             losartan (COZAAR) 50 MG tablet [Pharmacy Med Name: LOSARTAN POTASSIUM 50 MG TAB] 90 tablet 4    Sig: TAKE 1 TABLET BY MOUTH ONCE DAILY     Cardiovascular:  Angiotensin Receptor Blockers Failed - 01/19/2023  1:59 PM      Failed - Cr in normal range and within 180 days     Creatinine, Ser  Date Value Ref Range Status  08/14/2021 1.08 0.76 - 1.27 mg/dL Final         Failed - K in normal range and within 180 days    Potassium  Date Value Ref Range Status  08/14/2021 4.4 3.5 - 5.2 mmol/L Final         Failed - Valid encounter within last 6 months    Recent Outpatient Visits           1 year ago Moderate late onset Alzheimer's dementia without behavioral disturbance, psychotic disturbance, mood disturbance, or anxiety (HCC)   [place] [facility] [place], [place] T, NP   1 year ago Moderate dementia without behavioral disturbance, psychotic disturbance, mood disturbance, or anxiety, unspecified dementia type   [place] [facility] [place], Corrie Dandy T, NP   1 year ago Dementia without behavioral disturbance, unspecified dementia type   [place] [facility] [place], Dorie Rank, NP   1 year ago Memory changes   [place] [facility] [place], Corrie Dandy T, NP   2 years ago Memory changes   [place] [facility] [place], Dorie Rank, NP       Future Appointments  In 1 week Mecum, Oswaldo Conroy, PA-C [place] [facility]            Passed - Patient is not pregnant      Passed - Last BP in normal range    BP Readings from Last 1 Encounters:  08/14/21 113/65          donepezil (ARICEPT) 10 MG tablet [Pharmacy Med Name: DONEPEZIL HCL 10 MG TAB] 90 tablet 4    Sig: TAKE 1 TABLET BY MOUTH AT BEDTIME     Neurology:  Alzheimer's Agents Failed - 01/19/2023  1:59 PM      Failed - Valid encounter within last 6 months    Recent Outpatient Visits           1 year ago Moderate late onset Alzheimer's dementia without behavioral disturbance, psychotic disturbance, mood disturbance, or anxiety (HCC)   [place] [facility] [place], [place] T, NP   1 year ago Moderate dementia without behavioral disturbance, psychotic disturbance, mood disturbance, or  anxiety, unspecified dementia type   [place] [facility] [employer], Corrie Dandy T, NP   1 year ago Dementia without behavioral disturbance, unspecified dementia type   [place] [facility] [place], Dorie Rank, NP   1 year ago Memory changes   [place] [facility] [place], Corrie Dandy T, NP   2 years ago Memory changes   [place] [facility] [place], Corrie Dandy T, NP       Future Appointments             In 1 week Mecum, Oswaldo Conroy, PA-C [place] [facility]             atorvastatin (LIPITOR) 40 MG tablet [Pharmacy Med Name: ATORVASTATIN CALCIUM 40 MG TAB] 90 tablet 4    Sig: TAKE 1 TABLET BY MOUTH ONCE DAILY     Cardiovascular:  Antilipid - Statins Failed - 01/19/2023  1:59 PM      Failed - Valid encounter within last 12 months    Recent Outpatient Visits           1 year ago Moderate late onset Alzheimer's dementia without behavioral disturbance, psychotic disturbance, mood disturbance, or anxiety (HCC)   [place] [facility] [place], [place] T, NP   1 year ago Moderate dementia without behavioral disturbance, psychotic disturbance, mood disturbance, or anxiety, unspecified dementia type   [place] [facility] [place], Corrie Dandy T, NP   1 year ago Dementia without behavioral disturbance, unspecified dementia type   [place] [facility] [place], Dorie Rank, NP   1 year ago Memory changes   [place] [facility] [place], [place] T, NP   2 years ago Memory changes   [place] [facility] [place], [place] T, NP       Future Appointments             In 1 week Mecum, Oswaldo Conroy, PA-C [place] [facility]            Failed - Lipid Panel in normal range within the last 12 months    Cholesterol, Total  Date Value Ref Range Status  08/14/2021 120 100 - 199 mg/dL Final   Cholesterol Piccolo, Waived  Date Value Ref Range  Status  04/19/2016 128 <200 mg/dL Final    Comment:  Desirable                <200                         Borderline High      200- 239                         High                     >239    LDL Chol Calc (NIH)  Date Value Ref Range Status  08/14/2021 59 0 - 99 mg/dL Final   HDL  Date Value Ref Range Status  08/14/2021 42 >39 mg/dL Final   Triglycerides  Date Value Ref Range Status  08/14/2021 98 0 - 149 mg/dL Final   Triglycerides Piccolo,Waived  Date Value Ref Range Status  04/19/2016 114 <150 mg/dL Final    Comment:                            Normal                   <150                         Borderline High     150 - 199                         High                200 - 499                         Very High                >499          Passed - Patient is not pregnant       amLODipine (NORVASC) 10 MG tablet [Pharmacy Med Name: AMLODIPINE BESYLATE 10 MG TAB] 90 tablet 4    Sig: TAKE 1 TABLET BY MOUTH ONCE DAILY     Cardiovascular: Calcium Channel Blockers 2 Failed - 01/19/2023  1:59 PM      Failed - Valid encounter within last 6 months    Recent Outpatient Visits           1 year ago Moderate late onset Alzheimer's dementia without behavioral disturbance, psychotic disturbance, mood disturbance, or anxiety (HCC)   [place] [facility] [place], [place] T, NP   1 year ago Moderate dementia without behavioral disturbance, psychotic disturbance, mood disturbance, or anxiety, unspecified dementia type   [place] [facility] [place], Corrie Dandy T, NP   1 year ago Dementia without behavioral disturbance, unspecified dementia type   [place] [facility] [place], Dorie Rank, NP   1 year ago Memory changes   [place] [facility] [place], Corrie Dandy T, NP   2 years ago Memory changes   [place] [facility] [place], Dorie Rank, NP       Future Appointments              In 1 week Mecum, Oswaldo Conroy, PA-C [place] [facility]            Passed - Last BP in normal range    BP  Readings from Last 1 Encounters:  08/14/21 113/65         Passed - Last Heart Rate in normal range    Pulse Readings from Last 1 Encounters:  08/14/21 (!) 58

## 2023-01-31 ENCOUNTER — Ambulatory Visit: Payer: Medicare Other | Admitting: Physician Assistant

## 2023-02-23 ENCOUNTER — Encounter: Payer: Medicare Other | Admitting: Nurse Practitioner

## 2023-03-14 ENCOUNTER — Other Ambulatory Visit: Payer: Self-pay | Admitting: Nurse Practitioner

## 2023-03-15 ENCOUNTER — Telehealth: Payer: Self-pay | Admitting: Nurse Practitioner

## 2023-03-15 NOTE — Telephone Encounter (Signed)
Requested Prescriptions  Pending Prescriptions Disp Refills   propranolol (INDERAL) 20 MG tablet [Pharmacy Med Name: PROPRANOLOL HCL 20 MG TAB] 90 tablet 0    Sig: TAKE 1 TABLET BY MOUTH ONCE DAILY     Cardiovascular:  Beta Blockers Failed - 03/14/2023  4:31 PM      Failed - Valid encounter within last 6 months    Recent Outpatient Visits           1 year ago Moderate late onset Alzheimer's dementia without behavioral disturbance, psychotic disturbance, mood disturbance, or anxiety (HCC)   [place] [facility] [place], [place] T, NP   1 year ago Moderate dementia without behavioral disturbance, psychotic disturbance, mood disturbance, or anxiety, unspecified dementia type   [place] [facility] [place], Corrie Dandy T, NP   1 year ago Dementia without behavioral disturbance, unspecified dementia type   [place] [facility] [place], Dorie Rank, NP   2 years ago Memory changes   [place] [facility] [place], Corrie Dandy T, NP   2 years ago Memory changes   [place] [facility] [place], Dorie Rank, NP       Future Appointments             In 1 month [place], [place] T, NP [place] [facility]            Passed - Last BP in normal range    BP Readings from Last 1 Encounters:  08/14/21 113/65         Passed - Last Heart Rate in normal range    Pulse Readings from Last 1 Encounters:  08/14/21 (!) 58

## 2023-03-15 NOTE — Telephone Encounter (Signed)
Already requested by the pharmacy on 03/14/23 in a separate refill encounter. Refilled on 03/15/23 in that encounter.

## 2023-03-15 NOTE — Telephone Encounter (Signed)
Medication Refill - Medication: propranolol (INDERAL) 20 MG tablet  Has the patient contacted their pharmacy? Yes.   Mandy with the pharmacy is calling to request the refill. Per Angelica Chessman pt is part of the compliance packaging and his other prescriptions if being held up waiting on this refill. They are needing a new prescription for this medication, his prescription has expired.   Preferred Pharmacy (with phone number or street name):  [facility] [place] [place] - [address]  Phone: [phone] Fax: [phone]  Has the patient been seen for an appointment in the last year OR does the patient have an upcoming appointment? Yes.    Agent: Please be advised that RX refills may take up to 3 business days. We ask that you follow-up with your pharmacy.

## 2023-03-21 DIAGNOSIS — R9089 Other abnormal findings on diagnostic imaging of central nervous system: Secondary | ICD-10-CM | POA: Diagnosis not present

## 2023-03-21 DIAGNOSIS — R4701 Aphasia: Secondary | ICD-10-CM | POA: Diagnosis not present

## 2023-03-21 DIAGNOSIS — R413 Other amnesia: Secondary | ICD-10-CM | POA: Diagnosis not present

## 2023-03-21 DIAGNOSIS — E538 Deficiency of other specified B group vitamins: Secondary | ICD-10-CM | POA: Diagnosis not present

## 2023-03-21 DIAGNOSIS — Z789 Other specified health status: Secondary | ICD-10-CM | POA: Diagnosis not present

## 2023-04-16 NOTE — Patient Instructions (Signed)

## 2023-04-18 ENCOUNTER — Encounter: Payer: Self-pay | Admitting: Nurse Practitioner

## 2023-04-18 ENCOUNTER — Ambulatory Visit: Payer: Medicare Other | Admitting: Nurse Practitioner

## 2023-04-18 VITALS — BP 96/64 | HR 50 | Temp 97.7°F | Ht 68.5 in | Wt 174.4 lb

## 2023-04-18 DIAGNOSIS — G301 Alzheimer's disease with late onset: Secondary | ICD-10-CM | POA: Diagnosis not present

## 2023-04-18 DIAGNOSIS — I10 Essential (primary) hypertension: Secondary | ICD-10-CM

## 2023-04-18 DIAGNOSIS — I2510 Atherosclerotic heart disease of native coronary artery without angina pectoris: Secondary | ICD-10-CM | POA: Diagnosis not present

## 2023-04-18 DIAGNOSIS — E538 Deficiency of other specified B group vitamins: Secondary | ICD-10-CM

## 2023-04-18 DIAGNOSIS — Z23 Encounter for immunization: Secondary | ICD-10-CM

## 2023-04-18 DIAGNOSIS — K2100 Gastro-esophageal reflux disease with esophagitis, without bleeding: Secondary | ICD-10-CM | POA: Diagnosis not present

## 2023-04-18 DIAGNOSIS — E785 Hyperlipidemia, unspecified: Secondary | ICD-10-CM | POA: Diagnosis not present

## 2023-04-18 DIAGNOSIS — Z0000 Encounter for general adult medical examination without abnormal findings: Secondary | ICD-10-CM

## 2023-04-18 DIAGNOSIS — E7800 Pure hypercholesterolemia, unspecified: Secondary | ICD-10-CM

## 2023-04-18 DIAGNOSIS — R001 Bradycardia, unspecified: Secondary | ICD-10-CM | POA: Diagnosis not present

## 2023-04-18 DIAGNOSIS — F02B0 Dementia in other diseases classified elsewhere, moderate, without behavioral disturbance, psychotic disturbance, mood disturbance, and anxiety: Secondary | ICD-10-CM

## 2023-04-18 DIAGNOSIS — G250 Essential tremor: Secondary | ICD-10-CM

## 2023-04-18 MED ORDER — LOSARTAN POTASSIUM 25 MG PO TABS: 25.0000 mg | ORAL_TABLET | Freq: Every day | ORAL | 4 refills | Status: DC

## 2023-04-18 MED ORDER — PROPRANOLOL HCL 10 MG PO TABS: 10.0000 mg | ORAL_TABLET | Freq: Three times a day (TID) | ORAL | 4 refills | Status: DC

## 2023-04-18 NOTE — Assessment & Plan Note (Signed)
Ongoing for several years.  Has improved with Propranolol, suspect more essential tremor. Monitor HR with Propranolol on board, recommend they cut back on Propranolol to 10 MG TID. May need to adjust medication further at future visit.

## 2023-04-18 NOTE — Assessment & Plan Note (Addendum)
Chronic, ongoing.  Continue Prilosec and consider cut back in future. Risks of PPI use were discussed with patient including bone loss, C. Diff diarrhea, pneumonia, infections, CKD, electrolyte abnormalities.  Verbalizes understanding and chooses to continue the medication. Mag level today.

## 2023-04-18 NOTE — Assessment & Plan Note (Signed)
Dementia, volume loss noted on MRI.  Progression present from previous visit.  Recent labs stable, however recheck all today.  Continue to collaborate with neurology.  Recommend crossword puzzles at home and reading.  Will continue Aricept to 10 MG and Namenda as ordered by neurology.

## 2023-04-18 NOTE — Assessment & Plan Note (Addendum)
Chronic, stable.  Continue supplement at home and adjust as needed.  Recheck level today.

## 2023-04-18 NOTE — Assessment & Plan Note (Signed)
On review today.  Recommend they cut back on Losartan to 25 MG and Propranolol to 10 MG TID.  Labs today.

## 2023-04-18 NOTE — Assessment & Plan Note (Signed)
Chronic, ongoing.  Continue current medication regimen and adjust as needed. Lipid panel today. 

## 2023-04-18 NOTE — Progress Notes (Addendum)
BP 96/64 (BP Location: Left Arm, Patient Position: Sitting)   Pulse (!) 50   Temp 97.7 F (36.5 C) (Oral)   Ht 5' 8.5" (1.74 m)   Wt 174 lb 6.4 oz (79.1 kg)   SpO2 96%   BMI 26.13 kg/m    Subjective:    Patient ID: Michael Zuniga, male    DOB: 1951/01/09, 72 y.o.   MRN: [id number]  HPI: Michael Zuniga is a 72 y.o. male presenting on 04/18/2023 for Medicare Wellness and annual exam + follow-up. Current medical complaints include:none  Michael Zuniga currently lives with: daughter Interim Problems from his last visit: no  Lost to follow-up, last seen 04/29/21, prior to loss of his wife.  Daughter is present today to assist with HPI.  ALZHEIMER'S DEMENTIA Followed by neurology with last visit on 03/21/23, they decreased Losartan and ordered Speech/Cognitive therapy.  Daughter reports they have not heard from therapy and she has not reduced Losartan yet as Michael Zuniga has pill packs.  Continues to eat 3 meals a day + snacks.  Can dress self and shower.  Does go to bathroom on own, no incontinence.  Continues on Aricept and Namenda + takes Celexa 40 MG for mood.  Remains at baseline, no worsening per family.  Michael Zuniga continues on Propranolol 20 MG TID for essential tremor, but daughter reports minimally noticing this.  Takes B12 supplement daily for past low levels.   HYPERTENSION without Chronic Kidney Disease + HLD Continues on Losartan and Atorvastatin daily.    Continues on Prilosec for heart burn without issue. Hypertension status: stable  Satisfied with current treatment? yes Duration of hypertension: chronic BP monitoring frequency:  daily BP range: 131/74, 119/81, 117/74, 112/68 BP medication side effects:  no Medication compliance: good compliance Aspirin: no Recurrent headaches: no Visual changes: no Palpitations: no Dyspnea: no Chest pain: no Lower extremity edema: no Dizzy/lightheaded: no   Functional Status Survey: Is the patient deaf or have difficulty hearing?: No Does the patient have  difficulty seeing, even when wearing glasses/contacts?: No Does the patient have difficulty concentrating, remembering, or making decisions?: Yes Does the patient have difficulty walking or climbing stairs?: Yes Does the patient have difficulty dressing or bathing?: No Does the patient have difficulty doing errands alone such as visiting a doctor's office or shopping?: Yes  FALL RISK:    04/18/2023   11:02 AM 04/16/2020    2:08 PM 03/06/2019    3:11 PM 10/21/2017    3:09 PM 10/18/2016    2:04 PM  Fall Risk   Falls in the past year? 0 0 0 No No  Number falls in past yr: 0  0    Injury with Fall? 0  0    Risk for fall due to : No Fall Risks      Follow up Falls evaluation completed Falls evaluation completed       Depression Screen    04/18/2023   11:03 AM 08/14/2021    3:33 PM 04/29/2021    3:06 PM 04/16/2020    2:08 PM 03/06/2019    3:13 PM  Depression screen PHQ 2/9  Decreased Interest 0 0 0 0 0  Down, Depressed, Hopeless 0 0 0 0 0  PHQ - 2 Score 0 0 0 0 0  Altered sleeping 0 1 0 1 1  Tired, decreased energy 0 0 0 0 0  Change in appetite 0 0 0 0 0  Feeling bad or failure about yourself  0 0 0 0  0  Trouble concentrating 0 1 2 0 0  Moving slowly or fidgety/restless 0 0 0 0 0  Suicidal thoughts 0 0 0 0 0  PHQ-9 Score 0 2 2 1 1   Difficult doing work/chores Not difficult at all Not difficult at all Not difficult at all Not difficult at all Not difficult at all    Advanced Directives Does patient have a HCPOA?    no If yes, name and contact information:  Does patient have a living will or MOST form?  no  Past Medical History:  Past Medical History:  Diagnosis Date   Coronary artery disease    Dysphagia    High blood pressure    Hyperlipidemia     Surgical History:  Past Surgical History:  Procedure Laterality Date   CORONARY ANGIOPLASTY WITH STENT PLACEMENT     ESOPHAGOGASTRODUODENOSCOPY (EGD) WITH PROPOFOL N/A 07/11/2019   Procedure: ESOPHAGOGASTRODUODENOSCOPY (EGD)  WITH PROPOFOL;  Surgeon: Toney Reil, MD;  Location: [facility] ENDOSCOPY;  Service: Gastroenterology;  Laterality: N/A;   HERNIA REPAIR      Medications:  Current Outpatient Medications on File Prior to Visit  Medication Sig   amLODipine (NORVASC) 10 MG tablet TAKE 1 TABLET BY MOUTH ONCE DAILY   aspirin EC 81 MG tablet Take 81 mg by mouth daily.   atorvastatin (LIPITOR) 40 MG tablet TAKE 1 TABLET BY MOUTH ONCE DAILY   citalopram (CELEXA) 40 MG tablet Take 40 mg by mouth daily.   donepezil (ARICEPT) 10 MG tablet TAKE 1 TABLET BY MOUTH AT BEDTIME   memantine (NAMENDA) 10 MG tablet Take 10 mg by mouth 2 (two) times daily.   Omega-3 Fatty Acids (FISH OIL) 1200 MG CAPS Take 1 capsule by mouth daily.   omeprazole (PRILOSEC) 20 MG capsule TAKE 1 CAPSULE BY MOUTH ONCE DAILY   vitamin B-12 (CYANOCOBALAMIN) 1000 MCG tablet Take 1,000 mcg by mouth daily.   No current facility-administered medications on file prior to visit.    Allergies:  No Known Allergies  Social History:  Social History   Socioeconomic History   Marital status: Widowed    Spouse name: Not on file   Number of children: 2   Years of education: Not on file   Highest education level: Not on file  Occupational History   Occupation: Location manager  Tobacco Use   Smoking status: Never   Smokeless tobacco: Never  Vaping Use   Vaping status: Never Used  Substance and Sexual Activity   Alcohol use: No   Drug use: No   Sexual activity: Yes  Other Topics Concern   Not on file  Social History Narrative   Not on file   Social Determinants of Health   Financial Resource Strain: Medium Risk (11/21/2020)   Overall Financial Resource Strain (CARDIA)    Difficulty of Paying Living Expenses: Somewhat hard  Food Insecurity: No Food Insecurity (12/01/2020)   Hunger Vital Sign    Worried About Running Out of Food in the Last Year: Never true    Ran Out of Food in the Last Year: Never true  Transportation Needs: No  Transportation Needs (12/01/2020)   PRAPARE - Administrator, Civil Service (Medical): No    Lack of Transportation (Non-Medical): No  Physical Activity: Insufficiently Active (12/01/2020)   Exercise Vital Sign    Days of Exercise per Week: 3 days    Minutes of Exercise per Session: 30 min  Stress: No Stress Concern Present (12/01/2020)   [employer]  of Occupational Health - Occupational Stress Questionnaire    Feeling of Stress : Only a little  Social Connections: Moderately Isolated (12/01/2020)   Social Connection and Isolation Panel [NHANES]    Frequency of Communication with Friends and Family: More than three times a week    Frequency of Social Gatherings with Friends and Family: More than three times a week    Attends Religious Services: Never    Database administrator or Organizations: No    Attends Banker Meetings: Never    Marital Status: Married  Catering manager Violence: Not At Risk (12/01/2020)   Humiliation, Afraid, Rape, and Kick questionnaire    Fear of Current or Ex-Partner: No    Emotionally Abused: No    Physically Abused: No    Sexually Abused: No   Social History   Tobacco Use  Smoking Status Never  Smokeless Tobacco Never   Social History   Substance and Sexual Activity  Alcohol Use No    Family History:  Family History  Problem Relation Age of Onset   Lung cancer Mother    Lung cancer Father    Thyroid disease Daughter    Arthritis Maternal Grandmother    Colon cancer Neg Hx     Past medical history, surgical history, medications, allergies, family history and social history reviewed with patient today and changes made to appropriate areas of the chart.   ROS All other ROS negative except what is listed above and in the HPI.      Objective:    BP 96/64 (BP Location: Left Arm, Patient Position: Sitting)   Pulse (!) 50   Temp 97.7 F (36.5 C) (Oral)   Ht 5' 8.5" (1.74 m)   Wt 174 lb 6.4 oz (79.1 kg)   SpO2 96%    BMI 26.13 kg/m   Wt Readings from Last 3 Encounters:  04/18/23 174 lb 6.4 oz (79.1 kg)  08/14/21 163 lb 12.8 oz (74.3 kg)  04/29/21 165 lb 9.6 oz (75.1 kg)    No results found.  Physical Exam Vitals and nursing note reviewed.  Constitutional:      General: Michael Zuniga is awake. Michael Zuniga is not in acute distress.    Appearance: Normal appearance. Michael Zuniga is well-developed and well-groomed. Michael Zuniga is not ill-appearing or toxic-appearing.  HENT:     Head: Normocephalic and atraumatic.     Right Ear: Hearing, tympanic membrane, ear canal and external ear normal. No drainage.     Left Ear: Hearing, tympanic membrane, ear canal and external ear normal. No drainage.     Nose: Nose normal.     Mouth/Throat:     Pharynx: Uvula midline.  Eyes:     General: Lids are normal.        Right eye: No discharge.        Left eye: No discharge.     Extraocular Movements: Extraocular movements intact.     Conjunctiva/sclera: Conjunctivae normal.     Pupils: Pupils are equal, round, and reactive to light.     Visual Fields: Right eye visual fields normal and left eye visual fields normal.  Neck:     Thyroid: No thyromegaly.     Vascular: No carotid bruit or JVD.     Trachea: Trachea normal.  Cardiovascular:     Rate and Rhythm: Normal rate and regular rhythm.     Heart sounds: Normal heart sounds, S1 normal and S2 normal. No murmur heard.    No gallop.  Pulmonary:  Effort: Pulmonary effort is normal. No accessory muscle usage or respiratory distress.     Breath sounds: Normal breath sounds.  Abdominal:     General: Bowel sounds are normal.     Palpations: Abdomen is soft. There is no hepatomegaly or splenomegaly.     Tenderness: There is no abdominal tenderness.  Musculoskeletal:        General: Normal range of motion.     Cervical back: Normal range of motion and neck supple.     Right lower leg: No edema.     Left lower leg: No edema.  Lymphadenopathy:     Head:     Right side of head: No submental,  submandibular, tonsillar, preauricular or posterior auricular adenopathy.     Left side of head: No submental, submandibular, tonsillar, preauricular or posterior auricular adenopathy.     Cervical: No cervical adenopathy.  Skin:    General: Skin is warm and dry.     Capillary Refill: Capillary refill takes less than 2 seconds.     Findings: No rash.  Neurological:     Mental Status: Michael Zuniga is alert.     Cranial Nerves: Cranial nerves 2-12 are intact.     Gait: Gait is intact.     Deep Tendon Reflexes: Reflexes are normal and symmetric.     Reflex Scores:      Brachioradialis reflexes are 2+ on the right side and 2+ on the left side.      Patellar reflexes are 2+ on the right side and 2+ on the left side.    Comments: Pleasantly confused.  Not oriented to person, place, time.  Psychiatric:        Attention and Perception: Attention normal.        Mood and Affect: Mood normal.        Speech: Speech normal.        Behavior: Behavior normal. Behavior is cooperative.        Cognition and Memory: Cognition is impaired. Memory is impaired.       08/14/2021    3:36 PM 03/25/2021    3:17 PM 12/30/2020    3:30 PM 12/01/2020    3:18 PM 04/16/2020    2:30 PM  6CIT Screen  What Year? 4 points 4 points 0 points 4 points 0 points  What month? 3 points 3 points 0 points 3 points 0 points  What time? 3 points 3 points 3 points 3 points 0 points  Count back from 20 4 points 4 points 4 points 4 points 0 points  Months in reverse 4 points 4 points 4 points 4 points 0 points  Repeat phrase 10 points 10 points 10 points 10 points 0 points  Total Score 28 points 28 points 21 points 28 points 0 points   Results for orders placed or performed in visit on 08/14/21  Comprehensive metabolic panel  Result Value Ref Range   Glucose 100 (H) 70 - 99 mg/dL   BUN 18 8 - 27 mg/dL   Creatinine, Ser 1.91 0.76 - 1.27 mg/dL   eGFR 72 >47 WG/NFA/2.13   BUN/Creatinine Ratio 17 10 - 24   Sodium 144 134 - 144 mmol/L    Potassium 4.4 3.5 - 5.2 mmol/L   Chloride 105 96 - 106 mmol/L   CO2 24 20 - 29 mmol/L   Calcium 10.0 8.6 - 10.2 mg/dL   Total Protein 7.1 6.0 - 8.5 g/dL   Albumin 4.6 3.7 - 4.7 g/dL  Globulin, Total 2.5 1.5 - 4.5 g/dL   Albumin/Globulin Ratio 1.8 1.2 - 2.2   Bilirubin Total 0.5 0.0 - 1.2 mg/dL   Alkaline Phosphatase 106 44 - 121 IU/L   AST 25 0 - 40 IU/L   ALT 20 0 - 44 IU/L  Lipid Panel w/o Chol/HDL Ratio  Result Value Ref Range   Cholesterol, Total 120 100 - 199 mg/dL   Triglycerides 98 0 - 149 mg/dL   HDL 42 >16 mg/dL   VLDL Cholesterol Cal 19 5 - 40 mg/dL   LDL Chol Calc (NIH) 59 0 - 99 mg/dL  CBC with Differential/Platelet  Result Value Ref Range   WBC 5.4 3.4 - 10.8 x10E3/uL   RBC 4.23 4.14 - 5.80 x10E6/uL   Hemoglobin 12.8 (L) 13.0 - 17.7 g/dL   Hematocrit 10.9 60.4 - 51.0 %   MCV 91 79 - 97 fL   MCH 30.3 26.6 - 33.0 pg   MCHC 33.4 31.5 - 35.7 g/dL   RDW 54.0 98.1 - 19.1 %   Platelets 338 150 - 450 x10E3/uL   Neutrophils 64 Not Estab. %   Lymphs 20 Not Estab. %   Monocytes 12 Not Estab. %   Eos 3 Not Estab. %   Basos 1 Not Estab. %   Neutrophils Absolute 3.5 1.4 - 7.0 x10E3/uL   Lymphocytes Absolute 1.1 0.7 - 3.1 x10E3/uL   Monocytes Absolute 0.7 0.1 - 0.9 x10E3/uL   EOS (ABSOLUTE) 0.2 0.0 - 0.4 x10E3/uL   Basophils Absolute 0.1 0.0 - 0.2 x10E3/uL   Immature Granulocytes 0 Not Estab. %   Immature Grans (Abs) 0.0 0.0 - 0.1 x10E3/uL  Vitamin B12  Result Value Ref Range   Vitamin B-12 1,625 (H) 232 - 1,245 pg/mL      Assessment & Plan:   Problem List Items Addressed This Visit       Cardiovascular and Mediastinum   CAD (coronary artery disease)    Chronic, stable.  Continue daily ASA and Lipitor, adjust Lipitor dose as needed to meet goals and prevent stroke.  Could consider discontinuation of this in future as dementia progresses, to minimize medication.      Relevant Medications   losartan (COZAAR) 25 MG tablet   propranolol (INDERAL) 10 MG tablet    Hypertension    Chronic, with BP now on lower side.  Recommend Michael Zuniga monitor BP at least a few mornings a week at home and document.  DASH diet at home.  Recommend they cut back on Losartan to 25 MG and Propranolol to 10 MG TID.  Labs: CBC, TSH, and CMP.  Refills sent as needed.       Relevant Medications   losartan (COZAAR) 25 MG tablet   propranolol (INDERAL) 10 MG tablet   Other Relevant Orders   CBC with Differential/Platelet   TSH     Digestive   Gastroesophageal reflux disease with esophagitis without hemorrhage    Chronic, ongoing.  Continue Prilosec and consider cut back in future. Risks of PPI use were discussed with patient including bone loss, C. Diff diarrhea, pneumonia, infections, CKD, electrolyte abnormalities.  Verbalizes understanding and chooses to continue the medication. Mag level today.      Relevant Orders   Magnesium     Nervous and Auditory   Alzheimer's dementia (HCC)    Dementia, volume loss noted on MRI.  Progression present from previous visit.  Recent labs stable, however recheck all today.  Continue to collaborate with neurology.  Recommend crossword  puzzles at home and reading.  Will continue Aricept to 10 MG and Namenda as ordered by neurology.        Relevant Medications   memantine (NAMENDA) 10 MG tablet   citalopram (CELEXA) 40 MG tablet   Other Relevant Orders   HgB A1c   Benign essential tremor    Ongoing for several years.  Has improved with Propranolol, suspect more essential tremor. Monitor HR with Propranolol on board, recommend they cut back on Propranolol to 10 MG TID. May need to adjust medication further at future visit.        Other   B12 deficiency    Chronic, stable.  Continue supplement at home and adjust as needed.  Recheck level today.      Relevant Orders   Vitamin B12   Bradycardia    On review today.  Recommend they cut back on Losartan to 25 MG and Propranolol to 10 MG TID.  Labs today.      Hyperlipidemia    Chronic,  ongoing.  Continue current medication regimen and adjust as needed.  Lipid panel today.      Relevant Medications   losartan (COZAAR) 25 MG tablet   propranolol (INDERAL) 10 MG tablet   Other Relevant Orders   Comprehensive metabolic panel   Lipid Panel w/o Chol/HDL Ratio   Other Visit Diagnoses     Medicare annual wellness visit, initial    -  Primary   Medicare wellness due, performed today.   Encounter for annual physical exam       Annual physical today with labs and health maintenance reviewed, discussed with patient.       Discussed aspirin prophylaxis for myocardial infarction prevention and decision was made to continue ASA  LABORATORY TESTING:  Health maintenance labs ordered today as discussed above.   The natural history of prostate cancer and ongoing controversy regarding screening and potential treatment outcomes of prostate cancer has been discussed with the patient. The meaning of a false positive PSA and a false negative PSA has been discussed. Michael Zuniga indicates understanding of the limitations of this screening test and wishes not to proceed with screening PSA testing.   IMMUNIZATIONS:   - Tdap: Tetanus vaccination status reviewed: last tetanus booster within 10 years. - Influenza: Refused - Pneumovax: Up to date - Prevnar: Up to date - Zostavax vaccine: Refused  SCREENING: - Colonoscopy: Not applicable  Discussed with patient purpose of the colonoscopy is to detect colon cancer at curable precancerous or early stages   - AAA Screening: Not applicable  -Hearing Test: Not applicable  -Spirometry: Not applicable   PATIENT COUNSELING:    Sexuality: Discussed sexually transmitted diseases, partner selection, use of condoms, avoidance of unintended pregnancy  and contraceptive alternatives.   Advised to avoid cigarette smoking.  I discussed with the patient that most people either abstain from alcohol or drink within safe limits (<=14/week and <=4  drinks/occasion for males, <=7/weeks and <= 3 drinks/occasion for females) and that the risk for alcohol disorders and other health effects rises proportionally with the number of drinks per week and how often a drinker exceeds daily limits.  Discussed cessation/primary prevention of drug use and availability of treatment for abuse.   Diet: Encouraged to adjust caloric intake to maintain  or achieve ideal body weight, to reduce intake of dietary saturated fat and total fat, to limit sodium intake by avoiding high sodium foods and not adding table salt, and to maintain adequate dietary potassium and calcium  preferably from fresh fruits, vegetables, and low-fat dairy products.    Stressed the importance of regular exercise  Injury prevention: Discussed safety belts, safety helmets, smoke detector, smoking near bedding or upholstery.   Dental health: Discussed importance of regular tooth brushing, flossing, and dental visits.   Follow up plan: NEXT PREVENTATIVE PHYSICAL DUE IN 1 YEAR. Return in about 4 weeks (around 05/16/2023) for HYPOTENSION.

## 2023-04-18 NOTE — Assessment & Plan Note (Signed)
Chronic, stable.  Continue daily ASA and Lipitor, adjust Lipitor dose as needed to meet goals and prevent stroke.  Could consider discontinuation of this in future as dementia progresses, to minimize medication.

## 2023-04-18 NOTE — Assessment & Plan Note (Signed)
Chronic, with BP now on lower side.  Recommend he monitor BP at least a few mornings a week at home and document.  DASH diet at home.  Recommend they cut back on Losartan to 25 MG and Propranolol to 10 MG TID.  Labs: CBC, TSH, and CMP.  Refills sent as needed.

## 2023-04-19 LAB — COMPREHENSIVE METABOLIC PANEL
ALT: 22 [IU]/L (ref 0–44)
AST: 20 [IU]/L (ref 0–40)
Albumin: 4.4 g/dL (ref 3.8–4.8)
Alkaline Phosphatase: 100 [IU]/L (ref 44–121)
BUN/Creatinine Ratio: 18 (ref 10–24)
BUN: 26 mg/dL (ref 8–27)
Bilirubin Total: 0.4 mg/dL (ref 0.0–1.2)
CO2: 23 mmol/L (ref 20–29)
Calcium: 9.7 mg/dL (ref 8.6–10.2)
Chloride: 105 mmol/L (ref 96–106)
Creatinine, Ser: 1.48 mg/dL — ABNORMAL HIGH (ref 0.76–1.27)
Globulin, Total: 2.5 g/dL (ref 1.5–4.5)
Glucose: 94 mg/dL (ref 70–99)
Potassium: 4.6 mmol/L (ref 3.5–5.2)
Sodium: 141 mmol/L (ref 134–144)
Total Protein: 6.9 g/dL (ref 6.0–8.5)
eGFR: 49 mL/min/{1.73_m2} — ABNORMAL LOW (ref >59)

## 2023-04-19 LAB — CBC WITH DIFFERENTIAL/PLATELET
Basophils Absolute: 0 10*3/uL (ref 0.0–0.2)
Basos: 1 %
EOS (ABSOLUTE): 0.1 10*3/uL (ref 0.0–0.4)
Eos: 2 %
Hematocrit: 42 % (ref 37.5–51.0)
Hemoglobin: 13.7 g/dL (ref 13.0–17.7)
Immature Grans (Abs): 0 10*3/uL (ref 0.0–0.1)
Immature Granulocytes: 0 %
Lymphocytes Absolute: 1.4 10*3/uL (ref 0.7–3.1)
Lymphs: 23 %
MCH: 31.1 pg (ref 26.6–33.0)
MCHC: 32.6 g/dL (ref 31.5–35.7)
MCV: 95 fL (ref 79–97)
Monocytes Absolute: 0.6 10*3/uL (ref 0.1–0.9)
Monocytes: 10 %
Neutrophils Absolute: 3.9 10*3/uL (ref 1.4–7.0)
Neutrophils: 64 %
Platelets: 286 10*3/uL (ref 150–450)
RBC: 4.41 x10E6/uL (ref 4.14–5.80)
RDW: 12.6 % (ref 11.6–15.4)
WBC: 6 10*3/uL (ref 3.4–10.8)

## 2023-04-19 LAB — TSH: TSH: 0.93 u[IU]/mL (ref 0.450–4.500)

## 2023-04-19 LAB — LIPID PANEL W/O CHOL/HDL RATIO
Cholesterol, Total: 122 mg/dL (ref 100–199)
HDL: 39 mg/dL — ABNORMAL LOW (ref >39)
LDL Chol Calc (NIH): 56 mg/dL (ref 0–99)
Triglycerides: 155 mg/dL — ABNORMAL HIGH (ref 0–149)
VLDL Cholesterol Cal: 27 mg/dL (ref 5–40)

## 2023-04-19 LAB — MAGNESIUM: Magnesium: 2.2 mg/dL (ref 1.6–2.3)

## 2023-04-19 LAB — HEMOGLOBIN A1C
Est. average glucose Bld gHb Est-mCnc: 128 mg/dL
Hgb A1c MFr Bld: 6.1 % — ABNORMAL HIGH (ref 4.8–5.6)

## 2023-04-19 LAB — VITAMIN B12: Vitamin B-12: 1709 pg/mL — ABNORMAL HIGH (ref 232–1245)

## 2023-04-19 NOTE — Progress Notes (Signed)
Contacted via MyChart   Good afternoon, Anastasio's labs have returned: - Lipid panel shows a stable LDL, continue Atorvastatin for now.  Triglycerides mildly elevated, I would add more fish to diet. - B12 in very good range, we could even cut back on supplement to every other day in future. - A1c, diabetes testing, is in prediabetic range at 6.1%.  If gets to 6.5% or greater this is considered diabetes.  Focus heavily on diet, with less carb rich foods and sweets. - Kidney function, creatinine and eGFR, has come down from when I last saw him.  He is sitting in chronic kidney disease stage 3a range, mild disease.  This is new and I will recheck next visit.  I recommend ensuring he is getting good water intake daily. - Remainder of labs are stable.  Any questions? Keep being stellar!!  Thank you for allowing me to participate in your care.  I appreciate you. Kindest regards, Amarachukwu Lakatos

## 2023-05-30 DIAGNOSIS — R4701 Aphasia: Secondary | ICD-10-CM | POA: Diagnosis not present

## 2023-05-30 DIAGNOSIS — Z789 Other specified health status: Secondary | ICD-10-CM | POA: Diagnosis not present

## 2023-05-30 DIAGNOSIS — R413 Other amnesia: Secondary | ICD-10-CM | POA: Diagnosis not present

## 2023-06-18 NOTE — Patient Instructions (Signed)

## 2023-06-20 ENCOUNTER — Encounter: Payer: Self-pay | Admitting: Nurse Practitioner

## 2023-06-20 ENCOUNTER — Ambulatory Visit: Payer: Medicare Other | Admitting: Nurse Practitioner

## 2023-06-20 VITALS — BP 108/69 | HR 50 | Temp 97.3°F | Resp 18 | Wt 176.0 lb

## 2023-06-20 DIAGNOSIS — F02B0 Dementia in other diseases classified elsewhere, moderate, without behavioral disturbance, psychotic disturbance, mood disturbance, and anxiety: Secondary | ICD-10-CM

## 2023-06-20 DIAGNOSIS — G250 Essential tremor: Secondary | ICD-10-CM

## 2023-06-20 DIAGNOSIS — G301 Alzheimer's disease with late onset: Secondary | ICD-10-CM

## 2023-06-20 DIAGNOSIS — I10 Essential (primary) hypertension: Secondary | ICD-10-CM

## 2023-06-20 MED ORDER — PROPRANOLOL HCL 10 MG PO TABS: 10.0000 mg | ORAL_TABLET | Freq: Every day | ORAL | Status: DC

## 2023-06-20 NOTE — Assessment & Plan Note (Signed)
Ongoing for several years.  HR remains on lower side.  Recommend they reduce Propranolol to once a day and if no tremors noted can trial discontinuing this.  They will update provider if any issues.

## 2023-06-20 NOTE — Progress Notes (Signed)
BP 108/69   Pulse (!) 50   Temp (!) 97.3 F (36.3 C) (Oral)   Resp 18   Wt 176 lb (79.8 kg)   SpO2 95%   BMI 26.37 kg/m    Subjective:    Patient ID: Michael Zuniga, male    DOB: 03-26-1951, 72 y.o.   MRN: [id number]  HPI: Michael Zuniga is a 72 y.o. male  Chief Complaint  Patient presents with   Hypotension    Patient says his Neurologist had the patient stop taking the Amlodipine. Patient stopped on 06/08/23 and says patient blood pressure readings have been looking better. Patient daughter says the highest was 137/86. Patient follow with Dr Malvin Johns on 08/01/23.   ALZHEIMER'S DEMENTIA Follows with neurology, last visit on 05/30/23, they stopped Amlodipine and ordered Speech/Cognitive therapy.  Daughter reports they have not heard from therapy.  Continues to eat 3 meals a day and snacks.  Can dress self and shower self.  Does go to bathroom on own, no incontinence.  Continues on Aricept and Namenda + takes Celexa 40 MG for mood.  Remains at baseline, no worsening per family.  He continues on Propranolol 10 MG TID for essential tremor, daughter reports no significant tremor noticed.  Takes B12 supplement daily for past low levels.    HYPERTENSION without Chronic Kidney Disease  Continues on Losartan and Atorvastatin daily.  Reduced Amlodipine last visit + reduced Losartan and neurology recently stopped Amlodipine, BP has improved, no further hypotension. Hypertension status: stable  Satisfied with current treatment? yes Duration of hypertension: chronic BP monitoring frequency:  daily BP range: 110-120/70 range BP medication side effects:  no Medication compliance: good compliance Aspirin: no Recurrent headaches: no Visual changes: no Palpitations: no Dyspnea: no Chest pain: no Lower extremity edema: no Dizzy/lightheaded: no   Relevant past medical, surgical, family and social history reviewed and updated as indicated. Interim medical history since our last visit  reviewed. Allergies and medications reviewed and updated.  Review of Systems  Constitutional:  Negative for activity change, diaphoresis, fatigue and fever.  Respiratory:  Negative for cough, chest tightness, shortness of breath and wheezing.   Cardiovascular:  Negative for chest pain, palpitations and leg swelling.  Gastrointestinal: Negative.   Neurological:  Negative for dizziness, tremors, seizures, facial asymmetry, speech difficulty, weakness and headaches.  Psychiatric/Behavioral:  Positive for confusion (memory changes). Negative for self-injury, sleep disturbance and suicidal ideas. The patient is not nervous/anxious.     Per HPI unless specifically indicated above     Objective:    BP 108/69   Pulse (!) 50   Temp (!) 97.3 F (36.3 C) (Oral)   Resp 18   Wt 176 lb (79.8 kg)   SpO2 95%   BMI 26.37 kg/m   Wt Readings from Last 3 Encounters:  06/20/23 176 lb (79.8 kg)  04/18/23 174 lb 6.4 oz (79.1 kg)  08/14/21 163 lb 12.8 oz (74.3 kg)    Physical Exam Vitals and nursing note reviewed.  Constitutional:      General: He is awake. He is not in acute distress.    Appearance: He is well-developed and well-groomed. He is not ill-appearing.  HENT:     Head: Normocephalic and atraumatic.     Right Ear: Hearing normal. No drainage.     Left Ear: Hearing normal. No drainage.  Eyes:     General: Lids are normal.        Right eye: No discharge.  Left eye: No discharge.     Conjunctiva/sclera: Conjunctivae normal.     Pupils: Pupils are equal, round, and reactive to light.  Neck:     Vascular: No carotid bruit.  Cardiovascular:     Rate and Rhythm: Regular rhythm. Bradycardia present.     Heart sounds: Normal heart sounds, S1 normal and S2 normal. No murmur heard.    No gallop.  Pulmonary:     Effort: Pulmonary effort is normal. No accessory muscle usage or respiratory distress.     Breath sounds: Normal breath sounds.  Abdominal:     General: Bowel sounds are  normal.     Palpations: Abdomen is soft.  Musculoskeletal:        General: Normal range of motion.     Cervical back: Normal range of motion and neck supple.     Right lower leg: No edema.     Left lower leg: No edema.  Skin:    General: Skin is warm and dry.  Neurological:     Mental Status: He is alert.     Motor: No weakness, tremor (none noted today, stable) or atrophy.     Coordination: Romberg sign negative. Finger-Nose-Finger Test and Heel to [place] Test normal.     Gait: Gait is intact.     Deep Tendon Reflexes: Reflexes are normal and symmetric.     Reflex Scores:      Brachioradialis reflexes are 2+ on the right side and 2+ on the left side.      Patellar reflexes are 2+ on the right side.    Comments: Normal gait, no shortened or shuffle movements.  No tremor noted, active or resting.  No rigidity on exam.  No flat affect.  Not oriented to year, month, or day.  Psychiatric:        Attention and Perception: Attention normal.        Mood and Affect: Mood normal.        Speech: Speech normal.        Behavior: Behavior normal. Behavior is cooperative.    Results for orders placed or performed in visit on 04/18/23  CBC with Differential/Platelet   Collection Time: 04/18/23 11:42 AM  Result Value Ref Range   WBC 6.0 3.4 - 10.8 x10E3/uL   RBC 4.41 4.14 - 5.80 x10E6/uL   Hemoglobin 13.7 13.0 - 17.7 g/dL   Hematocrit 65.7 84.6 - 51.0 %   MCV 95 79 - 97 fL   MCH 31.1 26.6 - 33.0 pg   MCHC 32.6 31.5 - 35.7 g/dL   RDW 96.2 95.2 - 84.1 %   Platelets 286 150 - 450 x10E3/uL   Neutrophils 64 Not Estab. %   Lymphs 23 Not Estab. %   Monocytes 10 Not Estab. %   Eos 2 Not Estab. %   Basos 1 Not Estab. %   Neutrophils Absolute 3.9 1.4 - 7.0 x10E3/uL   Lymphocytes Absolute 1.4 0.7 - 3.1 x10E3/uL   Monocytes Absolute 0.6 0.1 - 0.9 x10E3/uL   EOS (ABSOLUTE) 0.1 0.0 - 0.4 x10E3/uL   Basophils Absolute 0.0 0.0 - 0.2 x10E3/uL   Immature Granulocytes 0 Not Estab. %   Immature Grans  (Abs) 0.0 0.0 - 0.1 x10E3/uL  Comprehensive metabolic panel   Collection Time: 04/18/23 11:42 AM  Result Value Ref Range   Glucose 94 70 - 99 mg/dL   BUN 26 8 - 27 mg/dL   Creatinine, Ser 3.24 (H) 0.76 - 1.27 mg/dL   eGFR  49 (L) >59 mL/min/1.73   BUN/Creatinine Ratio 18 10 - 24   Sodium 141 134 - 144 mmol/L   Potassium 4.6 3.5 - 5.2 mmol/L   Chloride 105 96 - 106 mmol/L   CO2 23 20 - 29 mmol/L   Calcium 9.7 8.6 - 10.2 mg/dL   Total Protein 6.9 6.0 - 8.5 g/dL   Albumin 4.4 3.8 - 4.8 g/dL   Globulin, Total 2.5 1.5 - 4.5 g/dL   Bilirubin Total 0.4 0.0 - 1.2 mg/dL   Alkaline Phosphatase 100 44 - 121 IU/L   AST 20 0 - 40 IU/L   ALT 22 0 - 44 IU/L  Lipid Panel w/o Chol/HDL Ratio   Collection Time: 04/18/23 11:42 AM  Result Value Ref Range   Cholesterol, Total 122 100 - 199 mg/dL   Triglycerides 161 (H) 0 - 149 mg/dL   HDL 39 (L) >09 mg/dL   VLDL Cholesterol Cal 27 5 - 40 mg/dL   LDL Chol Calc (NIH) 56 0 - 99 mg/dL  TSH   Collection Time: 04/18/23 11:42 AM  Result Value Ref Range   TSH 0.930 0.450 - 4.500 uIU/mL  Vitamin B12   Collection Time: 04/18/23 11:42 AM  Result Value Ref Range   Vitamin B-12 1,709 (H) 232 - 1,245 pg/mL  Magnesium   Collection Time: 04/18/23 11:42 AM  Result Value Ref Range   Magnesium 2.2 1.6 - 2.3 mg/dL  HgB U0A   Collection Time: 04/18/23 11:42 AM  Result Value Ref Range   Hgb A1c MFr Bld 6.1 (H) 4.8 - 5.6 %   Est. average glucose Bld gHb Est-mCnc 128 mg/dL      Assessment & Plan:   Problem List Items Addressed This Visit       Cardiovascular and Mediastinum   Hypertension   Chronic, improved with reduction of medications.  BP now in more stable range.  Recommend he monitor BP at least a few mornings a week at home and document.  DASH diet at home.  Recommend they continue Losartan 25 MG only.  Labs: up to date.         Relevant Medications   propranolol (INDERAL) 10 MG tablet     Nervous and Auditory   Alzheimer's dementia (HCC) -  Primary   Dementia, volume loss noted on MRI.  At present is stable and no significant progression noted on exam today.  Recent labs stable.  Continue to collaborate with neurology.  Recommend crossword puzzles at home and reading.  Will continue Aricept to 10 MG and Namenda as ordered by neurology.        Benign essential tremor   Ongoing for several years.  HR remains on lower side.  Recommend they reduce Propranolol to once a day and if no tremors noted can trial discontinuing this.  They will update provider if any issues.        Follow up plan: Return in about 6 months (around 12/14/2023) for DEMENTIA, HTN/HLD.

## 2023-06-20 NOTE — Assessment & Plan Note (Signed)
Chronic, improved with reduction of medications.  BP now in more stable range.  Recommend he monitor BP at least a few mornings a week at home and document.  DASH diet at home.  Recommend they continue Losartan 25 MG only.  Labs: up to date.

## 2023-06-20 NOTE — Assessment & Plan Note (Signed)
Dementia, volume loss noted on MRI.  At present is stable and no significant progression noted on exam today.  Recent labs stable.  Continue to collaborate with neurology.  Recommend crossword puzzles at home and reading.  Will continue Aricept to 10 MG and Namenda as ordered by neurology.

## 2023-08-01 DIAGNOSIS — G309 Alzheimer's disease, unspecified: Secondary | ICD-10-CM | POA: Diagnosis not present

## 2023-08-01 DIAGNOSIS — E538 Deficiency of other specified B group vitamins: Secondary | ICD-10-CM | POA: Diagnosis not present

## 2023-08-01 DIAGNOSIS — Z789 Other specified health status: Secondary | ICD-10-CM | POA: Diagnosis not present

## 2023-08-01 DIAGNOSIS — I959 Hypotension, unspecified: Secondary | ICD-10-CM | POA: Diagnosis not present

## 2023-08-01 DIAGNOSIS — R9089 Other abnormal findings on diagnostic imaging of central nervous system: Secondary | ICD-10-CM | POA: Diagnosis not present

## 2023-08-01 DIAGNOSIS — R413 Other amnesia: Secondary | ICD-10-CM | POA: Diagnosis not present

## 2023-08-01 DIAGNOSIS — R4701 Aphasia: Secondary | ICD-10-CM | POA: Diagnosis not present

## 2023-11-14 DIAGNOSIS — R413 Other amnesia: Secondary | ICD-10-CM | POA: Diagnosis not present

## 2023-11-14 DIAGNOSIS — G309 Alzheimer's disease, unspecified: Secondary | ICD-10-CM | POA: Diagnosis not present

## 2023-11-14 DIAGNOSIS — R4701 Aphasia: Secondary | ICD-10-CM | POA: Diagnosis not present

## 2023-11-14 DIAGNOSIS — I959 Hypotension, unspecified: Secondary | ICD-10-CM | POA: Diagnosis not present

## 2023-12-10 DIAGNOSIS — R7301 Impaired fasting glucose: Secondary | ICD-10-CM | POA: Insufficient documentation

## 2023-12-10 NOTE — Patient Instructions (Signed)
 Dementia Caregiver Guide Dementia is a condition that affects the way the brain works. It often affects thinking and memory. A person with dementia may: Forget things. Have trouble talking or responding to your questions. Have trouble paying attention. Have trouble thinking clearly and making good decisions. Get lost or wander away from home or other places. Have big changes in their mood or emotions. They may: Feel very worried, nervous, or depressed. Have angry outbursts. Be suspicious or accuse you of things. Have childlike behavior and language. Taking care of someone with dementia can be a challenge. The tips below can help you care for the person. How to help manage lifestyle changes Dementia usually gets worse slowly over time. In the early stages, people with dementia can stay safe and take care of themselves with some help. In later stages, they need help with daily tasks like getting dressed, grooming, and going to the bathroom. Communicating When the person is talking and seems frustrated, make eye contact and hold the person's hand. Ask questions that can be answered with a yes or no. Use simple words and a calm voice. Only give one direction at a time. Limit choices for the person. Too many choices can be stressful. Avoid correcting the person in a negative way. If the person can't find the right words, gently try to help. Preventing injury  Keep floors clear. Remove rugs, magazine racks, and floor lamps. Keep hallways well lit, especially at night. Put a handrail and nonslip mat in the bathtub or shower. Put childproof locks on cabinets that have dangerous items in them. These items include medicine, alcohol, guns, cleaning products, and sharp tools. For doors to the outside, put locks where the person can't see or reach them. This helps keep the person from going out of the house and getting lost. Be ready for emergencies. Keep a list of emergency phone numbers and  addresses close by. Remove car keys and lock garage doors so the person doesn't try to drive. Have the person wear a bracelet that tracks where they are and shows that they're a person with memory loss. This should be worn at all times for safety. Helping with daily life  Keep the person on track with their daily routine. Try to identify areas where the person may need help. Be supportive, patient, calm, and encouraging. Gently remind the person that adjusting to changes takes time. Help with the tasks that the person has asked for help with. Keep the person involved in daily tasks and decisions as much as you can. Encourage conversation, but try not to get frustrated if the person struggles to find words or doesn't seem to appreciate your help. Other tips Think about any safety risks and take steps to avoid them. Keep things organized: Organize medicines in a pill box for each day of the week. Keep a calendar in a central place. Use it to remind the person of health care visits or other activities. Create a plan to handle any legal or financial matters. Get help from a professional if needed. Help make sure the person: Takes medicines only as told by their health care providers. Eats regular, healthy meals. They should also drink plenty of fluids. Goes to all scheduled health care appointments. Gets regular sleep. Taking care of yourself Being a caregiver for someone with dementia can be hard. You may feel stressed and have many other emotions. It's important to also take care of yourself. Here are some tips: Find out about services that  can provide short-term care for the person. This is called respite care. It can allow you to take a break when you need one. Find healthy ways to deal with stress. Some ways include: Spending time with other people. Exercising. Meditating or doing deep breathing exercises. Take care of your own health by: Getting enough sleep. Eating healthy  foods. Getting regular exercise. Join a support group with others who are caregivers. These groups can help you: Learn other ways to deal with stress. Share experiences with others. Get emotional comfort and support. Learn about caregiving as the disease gets worse. Find resources in your community. Where to find support: Many people and organizations offer support. These include: Support groups for people with dementia. Support groups for caregivers. Counselors or therapists. Home health care services. Adult day care centers. Where to find more information Alzheimer's Association: [employer] Family Caregiver Alliance: caregiver.org Alzheimer's Foundation of [place]: alzfdn.org Contact a health care provider if: The person's health is quickly getting worse. You're no longer able to care for the person. Caring for the person is affecting your physical and emotional health. You're feeling worried, nervous, or depressed about caring for the person. Get help right away if: You feel like the person may hurt themselves or others. The person has talked about taking their own life. These symptoms may be an emergency. Take one of these steps right away: Go to your nearest emergency room. Call 911. Call the National Suicide Prevention Lifeline at (203) 656-8581 or 988. Text the Crisis Text Line at 480-614-0143. This information is not intended to replace advice given to you by your health care provider. Make sure you discuss any questions you have with your health care provider. Document Revised: 09/24/2022 Document Reviewed: 09/24/2022 Elsevier Patient Education  2024 [employer].

## 2023-12-12 ENCOUNTER — Ambulatory Visit (INDEPENDENT_AMBULATORY_CARE_PROVIDER_SITE_OTHER): Admitting: Nurse Practitioner

## 2023-12-12 ENCOUNTER — Encounter: Payer: Self-pay | Admitting: Nurse Practitioner

## 2023-12-12 VITALS — BP 74/42 | HR 59 | Temp 98.6°F | Resp 15 | Ht 68.5 in | Wt 167.0 lb

## 2023-12-12 DIAGNOSIS — I10 Essential (primary) hypertension: Secondary | ICD-10-CM | POA: Diagnosis not present

## 2023-12-12 DIAGNOSIS — I2510 Atherosclerotic heart disease of native coronary artery without angina pectoris: Secondary | ICD-10-CM | POA: Diagnosis not present

## 2023-12-12 DIAGNOSIS — F02B0 Dementia in other diseases classified elsewhere, moderate, without behavioral disturbance, psychotic disturbance, mood disturbance, and anxiety: Secondary | ICD-10-CM

## 2023-12-12 DIAGNOSIS — R001 Bradycardia, unspecified: Secondary | ICD-10-CM

## 2023-12-12 DIAGNOSIS — E7800 Pure hypercholesterolemia, unspecified: Secondary | ICD-10-CM

## 2023-12-12 DIAGNOSIS — G301 Alzheimer's disease with late onset: Secondary | ICD-10-CM

## 2023-12-12 DIAGNOSIS — R7301 Impaired fasting glucose: Secondary | ICD-10-CM | POA: Diagnosis not present

## 2023-12-12 LAB — BAYER DCA HB A1C WAIVED: HB A1C (BAYER DCA - WAIVED): 5.6 % (ref 4.8–5.6)

## 2023-12-12 NOTE — Assessment & Plan Note (Signed)
 A1c trend down from 6.1% to 5.6% today with diet changes and weight loss.  Praised for this.

## 2023-12-12 NOTE — Assessment & Plan Note (Signed)
 Chronic, ongoing.  Continue current medication regimen and adjust as needed. Lipid panel today.

## 2023-12-12 NOTE — Assessment & Plan Note (Signed)
 On review today, this is at baseline but improved from past with medication changes.  May need to discontinue Propranolol  in future.

## 2023-12-12 NOTE — Assessment & Plan Note (Signed)
 Chronic.  BP well below goal.  Recommend he monitor BP at least a few mornings a week at home and document.  DASH diet at home.  Recommend they stop Losartan  at this time, goal to avoid falls and orthostatic changes.  May need Midodrine in future if BP continues to run on lower side, he has lost weight though with diet changes over past months.  LABS today: CMP.

## 2023-12-12 NOTE — Assessment & Plan Note (Signed)
 Dementia, volume loss noted on MRI.  At present is stable and no significant progression noted on exam today.  Recent labs stable.  Continue to collaborate with neurology.  Recommend crossword puzzles at home and reading.  Will continue Aricept to 10 MG and Namenda as ordered by neurology.

## 2023-12-12 NOTE — Progress Notes (Signed)
 BP (!) 74/42 (BP Location: Left Arm, Patient Position: Sitting, Cuff Size: Normal)   Pulse (!) 59   Temp 98.6 F (37 C) (Oral)   Resp 15   Ht 5' 8.5 (1.74 m)   Wt 167 lb (75.8 kg)   SpO2 94%   BMI 25.02 kg/m    Subjective:    Patient ID: Michael Zuniga, male    DOB: 1950-09-01, 73 y.o.   MRN: [id number]  HPI: Michael Zuniga is a 73 y.o. male  Chief Complaint  Patient presents with   Dementia    Neurology mentioned ativan for overnight restless however with BP running low they decided not to start that.    Hypertension    Pressures have been running low. Daughter feels that he might be affected due to having to watch himself when walking that he may trip himself. Home pressures read 80/58 around most times.    ALZHEIMER'S DEMENTIA Last visit with neurology was on 11/14/23 with no changes, they offered Ativan by family declined. Continues Aricept , Namenda, Celexa.  Continues to eat 3 meals a day and snacks.  Can dress self and shower self.  Does go to bathroom on own, no incontinence.  Remains at baseline, no worsening per family.  He continues on Propranolol  10 MG daily for essential tremor, daughter reports no significant tremor noticed.  Takes B12 supplement daily for past low levels. Random behaviors at night. Does not recall names or people.   HYPERTENSION without Chronic Kidney Disease  Taking Losartan  and Atorvastatin  daily.  Has been holding onto wall more. Hypertension status: stable  Satisfied with current treatment? yes Duration of hypertension: chronic BP monitoring frequency:  daily BP range: 80/60 range BP medication side effects:  no Medication compliance: good compliance Aspirin: no Recurrent headaches: no Visual changes: no Palpitations: no Dyspnea: no Chest pain: no Lower extremity edema: no Dizzy/lightheaded: no   Impaired Fasting Glucose Has been eating less carbs and has lost weight with this, 9 pounds. HbA1C:  Lab Results  Component Value Date    HGBA1C 5.6 12/12/2023  Duration of elevated blood sugar: chronic Polydipsia: no Polyuria: no Weight change: no Visual disturbance: no Glucose Monitoring: no    Accucheck frequency: Not Checking    Fasting glucose:     Post prandial:  Diabetic Education: Not Completed Family history of diabetes: unsure   Relevant past medical, surgical, family and social history reviewed and updated as indicated. Interim medical history since our last visit reviewed. Allergies and medications reviewed and updated.  Review of Systems  Constitutional:  Negative for activity change, diaphoresis, fatigue and fever.  Respiratory:  Negative for cough, chest tightness, shortness of breath and wheezing.   Cardiovascular:  Negative for chest pain, palpitations and leg swelling.  Gastrointestinal: Negative.   Neurological:  Negative for dizziness, tremors, seizures, facial asymmetry, speech difficulty, weakness and headaches.  Psychiatric/Behavioral:  Positive for confusion (memory changes). Negative for self-injury, sleep disturbance and suicidal ideas. The patient is not nervous/anxious.     Per HPI unless specifically indicated above     Objective:    BP (!) 74/42 (BP Location: Left Arm, Patient Position: Sitting, Cuff Size: Normal)   Pulse (!) 59   Temp 98.6 F (37 C) (Oral)   Resp 15   Ht 5' 8.5 (1.74 m)   Wt 167 lb (75.8 kg)   SpO2 94%   BMI 25.02 kg/m   Wt Readings from Last 3 Encounters:  12/12/23 167 lb (75.8 kg)  06/20/23 176 lb (79.8 kg)  04/18/23 174 lb 6.4 oz (79.1 kg)    Physical Exam Vitals and nursing note reviewed.  Constitutional:      General: He is awake. He is not in acute distress.    Appearance: He is well-developed and well-groomed. He is not ill-appearing.  HENT:     Head: Normocephalic and atraumatic.     Right Ear: Hearing normal. No drainage.     Left Ear: Hearing normal. No drainage.   Eyes:     General: Lids are normal.        Right eye: No discharge.         Left eye: No discharge.     Conjunctiva/sclera: Conjunctivae normal.     Pupils: Pupils are equal, round, and reactive to light.   Neck:     Vascular: No carotid bruit.   Cardiovascular:     Rate and Rhythm: Regular rhythm. Bradycardia present.     Heart sounds: Normal heart sounds, S1 normal and S2 normal. No murmur heard.    No gallop.  Pulmonary:     Effort: Pulmonary effort is normal. No accessory muscle usage or respiratory distress.     Breath sounds: Normal breath sounds.  Abdominal:     General: Bowel sounds are normal.     Palpations: Abdomen is soft.   Musculoskeletal:        General: Normal range of motion.     Cervical back: Normal range of motion and neck supple.     Right lower leg: No edema.     Left lower leg: No edema.   Skin:    General: Skin is warm and dry.   Neurological:     Mental Status: He is alert.     Motor: No weakness, tremor (none noted today, stable) or atrophy.     Coordination: Romberg sign negative. Finger-Nose-Finger Test and Heel to [place] Test normal.     Gait: Gait is intact.     Deep Tendon Reflexes: Reflexes are normal and symmetric.     Reflex Scores:      Brachioradialis reflexes are 2+ on the right side and 2+ on the left side.      Patellar reflexes are 2+ on the right side.    Comments: Normal gait, even dancing for provider.  No tremor noted. Not oriented to year, month, or day.  Psychiatric:        Attention and Perception: Attention normal.        Mood and Affect: Mood normal.        Speech: Speech normal.        Behavior: Behavior normal. Behavior is cooperative.    Results for orders placed or performed in visit on 12/12/23  Bayer DCA Hb A1c Waived   Collection Time: 12/12/23  2:56 PM  Result Value Ref Range   HB A1C (BAYER DCA - WAIVED) 5.6 4.8 - 5.6 %      Assessment & Plan:   Problem List Items Addressed This Visit       Cardiovascular and Mediastinum   Hypertension   Chronic.  BP well below goal.   Recommend he monitor BP at least a few mornings a week at home and document.  DASH diet at home.  Recommend they stop Losartan  at this time, goal to avoid falls and orthostatic changes.  May need Midodrine in future if BP continues to run on lower side, he has lost weight though with diet changes over past  months.  LABS today: CMP.       CAD (coronary artery disease)   Chronic, stable.  Continue daily ASA and Lipitor, adjust Lipitor dose as needed to meet goals and prevent stroke.  Could consider discontinuation of this in future as dementia progresses, to minimize medication.        Endocrine   IFG (impaired fasting glucose)   A1c trend down from 6.1% to 5.6% today with diet changes and weight loss.  Praised for this.      Relevant Orders   Bayer DCA Hb A1c Waived (Completed)     Nervous and Auditory   Alzheimer's dementia (HCC) - Primary   Dementia, volume loss noted on MRI.  At present is stable and no significant progression noted on exam today.  Recent labs stable.  Continue to collaborate with neurology.  Recommend crossword puzzles at home and reading.  Will continue Aricept  to 10 MG and Namenda as ordered by neurology.          Other   Hyperlipidemia   Chronic, ongoing.  Continue current medication regimen and adjust as needed.  Lipid panel today.      Relevant Orders   Comprehensive metabolic panel with GFR   Lipid Panel w/o Chol/HDL Ratio   Bradycardia   On review today, this is at baseline but improved from past with medication changes.  May need to discontinue Propranolol  in future.        Follow up plan: Return in about 4 weeks (around 01/09/2024) for HYPOTENSION - stopped Losartan .

## 2023-12-12 NOTE — Assessment & Plan Note (Signed)
Chronic, stable.  Continue daily ASA and Lipitor, adjust Lipitor dose as needed to meet goals and prevent stroke.  Could consider discontinuation of this in future as dementia progresses, to minimize medication.

## 2023-12-13 ENCOUNTER — Ambulatory Visit: Payer: Self-pay | Admitting: Nurse Practitioner

## 2023-12-13 LAB — LIPID PANEL W/O CHOL/HDL RATIO
Cholesterol, Total: 130 mg/dL (ref 100–199)
HDL: 37 mg/dL — ABNORMAL LOW (ref >39)
LDL Chol Calc (NIH): 73 mg/dL (ref 0–99)
Triglycerides: 105 mg/dL (ref 0–149)
VLDL Cholesterol Cal: 20 mg/dL (ref 5–40)

## 2023-12-13 LAB — COMPREHENSIVE METABOLIC PANEL WITH GFR
ALT: 27 IU/L (ref 0–44)
AST: 22 IU/L (ref 0–40)
Albumin: 4.3 g/dL (ref 3.8–4.8)
Alkaline Phosphatase: 108 IU/L (ref 44–121)
BUN/Creatinine Ratio: 15 (ref 10–24)
BUN: 23 mg/dL (ref 8–27)
Bilirubin Total: 0.5 mg/dL (ref 0.0–1.2)
CO2: 20 mmol/L (ref 20–29)
Calcium: 9.5 mg/dL (ref 8.6–10.2)
Chloride: 105 mmol/L (ref 96–106)
Creatinine, Ser: 1.54 mg/dL — ABNORMAL HIGH (ref 0.76–1.27)
Globulin, Total: 2.2 g/dL (ref 1.5–4.5)
Glucose: 127 mg/dL — ABNORMAL HIGH (ref 70–99)
Potassium: 4.3 mmol/L (ref 3.5–5.2)
Sodium: 141 mmol/L (ref 134–144)
Total Protein: 6.5 g/dL (ref 6.0–8.5)
eGFR: 47 mL/min/1.73 — ABNORMAL LOW

## 2023-12-13 NOTE — Progress Notes (Signed)
 Contacted via MyChart  Good afternoon, Michael Zuniga's labs have returned: - Kidney function, creatinine and eGFR, continues to show chronic kidney disease Stage 3a with no progression in disease.  We will continue to monitor closely.  Ensure good water intake daily and avoid Ibuprofen products. - Lipid panel continues to look great.   - Glucose up a little, but A1c was stable.  No changes needed.  Any questions? Keep being amazing!!  Thank you for allowing me to participate in your care.  I appreciate you. Kindest regards, Ulyssa Walthour

## 2023-12-19 ENCOUNTER — Ambulatory Visit: Payer: Self-pay | Admitting: Nurse Practitioner

## 2023-12-23 ENCOUNTER — Telehealth: Payer: Self-pay

## 2023-12-23 NOTE — Telephone Encounter (Signed)
 Unable to make that decision without provider review. Forwarding to PCP for assistance.

## 2023-12-23 NOTE — Telephone Encounter (Signed)
 Copied from CRM [id number]. Topic: Clinical - Medical Advice >> Dec 23, 2023 11:23 AM Powell HERO wrote: Reason for CRM: Patient is wanting to know if he needs his follow up appointment on the 14th, he states that his blood pressure has been stable.  Please advise per patient request.

## 2023-12-26 NOTE — Telephone Encounter (Signed)
 Called and LVM asking for patient to please return my call.   OK for E2C2 to speak with patient and advise him of [name] message if patient calls back.

## 2023-12-27 NOTE — Telephone Encounter (Signed)
Called and LVM asking for patient's daughter to please return my call.  

## 2023-12-28 ENCOUNTER — Ambulatory Visit: Payer: Self-pay

## 2023-12-28 NOTE — Telephone Encounter (Signed)
 FYI Only or Action Required?: FYI only for provider.  Patient was last seen in primary care on 12/12/2023 by Valerio Melanie DASEN, NP. Called Nurse Triage reporting No chief complaint on file.. Symptoms began N/A. Interventions attempted: Nothing. Symptoms are: unchanged.  Triage Disposition: No disposition on file.  Patient/caregiver understands and will follow disposition?:  Answer Assessment - Initial Assessment Questions 1. REASON FOR CALL or QUESTION: What is your reason for calling today? or How can I best help you? or What question do you have that I can help answer?     Follow Up on PCP's message.  2. CALLER: Document the source of call. (e.g., laboratory, patient).     Daughter, Amy Esbenshade  Protocols used: PCP Call - No Triage-A-AH  Attempted the daughter of the patient twice, no answer. Left a voicemail.

## 2024-01-06 ENCOUNTER — Ambulatory Visit: Payer: Self-pay

## 2024-01-06 NOTE — Telephone Encounter (Signed)
 FYI Only or Action Required?: Action required by provider: update on patient condition and verification to cancel appt Monday and call patient's daughter back to confirm it has been cancelled if so.  Patient was last seen in primary care on 12/12/2023 by Michael Melanie DASEN, NP.  Called Nurse Triage reporting Information Only.   Triage Disposition: Call PCP When Office is Open  Patient/caregiver understands and will follow disposition?: Yes      Patient's daughter called back and advised these numbers for recent blood pressures:  119/73 Monday 01/03/2024 131/71 Wednesday 01/04/2024 118/67  01/05/2024 121/69 01/06/2024  With patient's blood pressures consistently reading higher than 100/50-60----Daughter asked if patient doesn't need to come to the appointment Monday 01/09/2024 at 11 AM with PCP---patient's daughter states this can be cancelled  Patient's work hours are the same as the PCP office so she cannot call back today.  Patient's daughter states that a message can be left on her voicemail with details.  This RN called CAL to ensure that this was all that needed to be relayed to the patient and blood pressures obtained--- Advised to send this CRM message. This RN did not cancel the appointment for Monday ----due to the message being from two weeks ago and I wanted to ensure that there was nothing else that needed to be relayed to the patient/patient's daughter.  This RN did obtain those blood pressures, daughter states that they are consistent above 100/50-60 so if Monday's appointment is cancelled please leave patient a voicemail to let her know that it is cancelled.  Patient's daughter: [phone]                Copied from CRM [id number]. Topic: Clinical - Pink Word Triage >> Dec 28, 2023  1:42 PM Michael Zuniga wrote: Michael Zuniga, CMA, OK for E2C2 to speak with patient and advise him of Michael Zuniga message if patient calls back. Reason for Disposition  [1] Caller  requesting NON-URGENT health information AND [2] PCP's office is the best resource  Answer Assessment - Initial Assessment Questions Patient's daughter returning phone call to the office.   Patient's daughter called back and advised these numbers for recent blood pressures:  119/73 Monday 01/03/2024 131/71 Wednesday 01/04/2024 118/67  01/05/2024 121/69 01/06/2024  With patient's blood pressures consistently reading higher than 100/50-60----Daughter asked if patient doesn't need to come to the appointment Monday 01/09/2024 at 11 AM with PCP---patient's daughter states this can be cancelled  Patient's work hours are the same as the PCP office so she cannot call back today.  Patient's daughter states that a message can be left on her voicemail with details.  This RN called CAL to ensure that this was all that needed to be relayed to the patient and blood pressures obtained---This RN did not cancel the appointment for Monday ----due to the message being from two weeks ago and I wanted to ensure that there was nothing else that needed to be relayed to the patient/patient's daughter.  This RN did obtain those blood pressures, daughter states that they are consistent above 100/50-60 so if Monday's appointment is cancelled please leave patient a voicemail to let her know that it is cancelled.  Patient's daughter: [phone]  Protocols used: Information Only Call - No Triage-A-AH

## 2024-01-07 NOTE — Patient Instructions (Incomplete)

## 2024-01-09 ENCOUNTER — Ambulatory Visit: Admitting: Nurse Practitioner

## 2024-01-09 NOTE — Telephone Encounter (Signed)
 Called and notified patient's daughter of [name] message. 3 month f/up scheduled.

## 2024-01-23 ENCOUNTER — Other Ambulatory Visit: Payer: Self-pay | Admitting: Nurse Practitioner

## 2024-01-24 ENCOUNTER — Other Ambulatory Visit: Payer: Self-pay | Admitting: Nurse Practitioner

## 2024-01-24 NOTE — Telephone Encounter (Unsigned)
 Copied from CRM [id number]. Topic: Clinical - Medication Refill >> Jan 24, 2024 12:00 PM Carlatta H wrote: Medication: propranolol  (INDERAL ) 10 MG tablet omeprazole  (PRILOSEC) 20 MG capsule atorvastatin  (LIPITOR) 40 MG tablet  Has the patient contacted their pharmacy? No (Agent: If no, request that the patient contact the pharmacy for the refill. If patient does not wish to contact the pharmacy document the reason why and proceed with request.) (Agent: If yes, when and what did the pharmacy advise?)  This is the patient's preferred pharmacy:  [facility] - [place], [place] - [address] [place] [address] Phone: [phone] Fax: [phone]    Is this the correct pharmacy for this prescription? Yes If no, delete pharmacy and type the correct one.   Has the prescription been filled recently? No  Is the patient out of the medication? Yes  Has the patient been seen for an appointment in the last year OR does the patient have an upcoming appointment? Yes  Can we respond through MyChart? Yes  Agent: Please be advised that Rx refills may take up to 3 business days. We ask that you follow-up with your pharmacy.

## 2024-01-24 NOTE — Telephone Encounter (Signed)
 Requested Prescriptions  Pending Prescriptions Disp Refills   omeprazole  (PRILOSEC) 20 MG capsule [Pharmacy Med Name: OMEPRAZOLE  DR 20 MG CAP] 90 capsule 1    Sig: TAKE 1 CAPSULE BY MOUTH ONCE DAILY     Gastroenterology: Proton Pump Inhibitors Passed - 01/24/2024  5:06 PM      Passed - Valid encounter within last 12 months    Recent Outpatient Visits           1 month ago Moderate late onset Alzheimer's dementia without behavioral disturbance, psychotic disturbance, mood disturbance, or anxiety (HCC)   [place] [facility] [place], Jolene T, NP               propranolol  (INDERAL ) 10 MG tablet [Pharmacy Med Name: PROPRANOLOL  HCL 10 MG TAB] 90 tablet 2    Sig: TAKE 1 TABLET BY MOUTH 3 TIMES DAILY     Cardiovascular:  Beta Blockers Failed - 01/24/2024  5:06 PM      Failed - Last BP in normal range    BP Readings from Last 1 Encounters:  12/12/23 (!) 74/42         Passed - Last Heart Rate in normal range    Pulse Readings from Last 1 Encounters:  12/12/23 (!) 59         Passed - Valid encounter within last 6 months    Recent Outpatient Visits           1 month ago Moderate late onset Alzheimer's dementia without behavioral disturbance, psychotic disturbance, mood disturbance, or anxiety (HCC)   [place] [facility] [place], Jolene T, NP               atorvastatin  (LIPITOR) 40 MG tablet [Pharmacy Med Name: ATORVASTATIN  CALCIUM  40 MG TAB] 90 tablet 1    Sig: TAKE 1 TABLET BY MOUTH ONCE DAILY     Cardiovascular:  Antilipid - Statins Failed - 01/24/2024  5:06 PM      Failed - Lipid Panel in normal range within the last 12 months    Cholesterol, Total  Date Value Ref Range Status  12/12/2023 130 100 - 199 mg/dL Final   Cholesterol Piccolo, Waived  Date Value Ref Range Status  04/19/2016 128 <200 mg/dL Final    Comment:                            Desirable                <200                         Borderline High      200- 239                          High                     >239    LDL Chol Calc (NIH)  Date Value Ref Range Status  12/12/2023 73 0 - 99 mg/dL Final   HDL  Date Value Ref Range Status  12/12/2023 37 (L) >39 mg/dL Final   Triglycerides  Date Value Ref Range Status  12/12/2023 105 0 - 149 mg/dL Final   Triglycerides Piccolo,Waived  Date Value Ref Range Status  04/19/2016 114 <150 mg/dL Final    Comment:  Normal                   <150                         Borderline High     150 - 199                         High                200 - 499                         Very High                >499          Passed - Patient is not pregnant      Passed - Valid encounter within last 12 months    Recent Outpatient Visits           1 month ago Moderate late onset Alzheimer's dementia without behavioral disturbance, psychotic disturbance, mood disturbance, or anxiety (HCC)   [place] [facility] [place], Melanie DASEN, NP

## 2024-01-25 NOTE — Telephone Encounter (Signed)
 Refused Inderal , omeprazole , and atorvastatin  because these are duplicate requests.   All 3 sent in 01/24/2024 #90, 1 refill each to [facility]

## 2024-04-05 ENCOUNTER — Other Ambulatory Visit: Payer: Self-pay | Admitting: Nurse Practitioner

## 2024-04-05 NOTE — Telephone Encounter (Signed)
 Copied from CRM [id number]. Topic: Clinical - Medication Refill >> Apr 05, 2024  4:42 PM Delon HERO wrote: Medication: aspirin EC 81 MG tablet [[id number]] [facility] calling to request this medication to go into pill packet  Has the patient contacted their pharmacy? Yes (Agent: If no, request that the patient contact the pharmacy for the refill. If patient does not wish to contact the pharmacy document the reason why and proceed with request.) (Agent: If yes, when and what did the pharmacy advise?)  This is the patient's preferred pharmacy:  [facility] - [place], [place] - [address] [place] [address] Phone: [phone] Fax: [phone]   Is this the correct pharmacy for this prescription? Yes If no, delete pharmacy and type the correct one.   Has the prescription been filled recently? Yes  Is the patient out of the medication? Yes  Has the patient been seen for an appointment in the last year OR does the patient have an upcoming appointment? Yes  Can we respond through MyChart? Yes  Agent: Please be advised that Rx refills may take up to 3 business days. We ask that you follow-up with your pharmacy.

## 2024-04-08 NOTE — Patient Instructions (Incomplete)
 Be Involved in Caring For Your Health:  Taking Medications When medications are taken as directed, they can greatly improve your health. But if they are not taken as prescribed, they may not work. In some cases, not taking them correctly can be harmful. To help ensure your treatment remains effective and safe, understand your medications and how to take them. Bring your medications to each visit for review by your provider.  Your lab results, notes, and after visit summary will be available on My Chart. We strongly encourage you to use this feature. If lab results are abnormal the clinic will contact you with the appropriate steps. If the clinic does not contact you assume the results are satisfactory. You can always view your results on My Chart. If you have questions regarding your health or results, please contact the clinic during office hours. You can also ask questions on My Chart.  We at [facility] are grateful that you chose us  to provide your care. We strive to provide evidence-based and compassionate care and are always looking for feedback. If you get a survey from the clinic please complete this so we can hear your opinions.   Management of Memory Problems  There are some general things you can do to help manage your memory problems.  Your memory may not in fact recover, but by using techniques and strategies you will be able to manage your memory difficulties better.  1)  Establish a routine. Try to establish and then stick to a regular routine.  By doing this, you will get used to what to expect and you will reduce the need to rely on your memory.  Also, try to do things at the same time of day, such as taking your medication or checking your calendar first thing in the morning. Think about think that you can do as a part of a regular routine and make a list.  Then enter them into a daily planner to remind you.  This will help you establish a routine.  2)  Organize your  environment. Organize your environment so that it is uncluttered.  Decrease visual stimulation.  Place everyday items such as keys or cell phone in the same place every day (ie.  Basket next to front door) Use post it notes with a brief message to yourself (ie. Turn off light, lock the door) Use labels to indicate where things go (ie. Which cupboards are for food, dishes, etc.) Keep a notepad and pen by the telephone to take messages  3)  Memory Aids A diary or journal/notebook/daily planner Making a list (shopping list, chore list, to do list that needs to be done) Using an alarm as a reminder (kitchen timer or cell phone alarm) Using cell phone to store information (Notes, Calendar, Reminders) Calendar/White board placed in a prominent position Post-it notes  In order for memory aids to be useful, you need to have good habits.  It's no good remembering to make a note in your journal if you don't remember to look in it.  Try setting aside a certain time of day to look in journal.  4)  Improving mood and managing fatigue. There may be other factors that contribute to memory difficulties.  Factors, such as anxiety, depression and tiredness can affect memory. Regular gentle exercise can help improve your mood and give you more energy. Simple relaxation techniques may help relieve symptoms of anxiety Try to get back to completing activities or hobbies you enjoyed doing in  the past. Learn to pace yourself through activities to decrease fatigue. Find out about some local support groups where you can share experiences with others. Try and achieve 7-8 hours of sleep at night.

## 2024-04-09 MED ORDER — ASPIRIN EC 81 MG PO TBEC: 81.0000 mg | DELAYED_RELEASE_TABLET | Freq: Every day | ORAL | 5 refills | Status: AC

## 2024-04-09 NOTE — Telephone Encounter (Signed)
 Requested medications are due for refill today.  unsure  Requested medications are on the active medications list.  yes  Last refill. Medication is historical  Future visit scheduled.   yes  Notes to clinic.  Medication is historical. [facility] requesting his be added to pill packet.    Requested Prescriptions  Pending Prescriptions Disp Refills   aspirin EC 81 MG tablet 30 tablet     Sig: Take 1 tablet (81 mg total) by mouth daily.     Analgesics:  NSAIDS - aspirin Failed - 04/09/2024  1:20 PM      Failed - Cr in normal range and within 360 days    Creatinine, Ser  Date Value Ref Range Status  12/12/2023 1.54 (H) 0.76 - 1.27 mg/dL Final         Passed - eGFR is 10 or above and within 360 days    GFR calc Af Amer  Date Value Ref Range Status  04/16/2020 79 >59 mL/min/1.73 Final    Comment:    **In accordance with recommendations from the NKF-ASN Task force,**   [facility] is in the process of updating its eGFR calculation to the   2021 CKD-EPI creatinine equation that estimates kidney function   without a race variable.    GFR calc non Af Amer  Date Value Ref Range Status  04/16/2020 69 >59 mL/min/1.73 Final   eGFR  Date Value Ref Range Status  12/12/2023 47 (L) >59 mL/min/1.73 Final         Passed - Patient is not pregnant      Passed - Valid encounter within last 12 months    Recent Outpatient Visits           3 months ago Moderate late onset Alzheimer's dementia without behavioral disturbance, psychotic disturbance, mood disturbance, or anxiety (HCC)    [facility] [place], Melanie DASEN, NP

## 2024-04-11 ENCOUNTER — Ambulatory Visit: Admitting: Nurse Practitioner

## 2024-04-11 DIAGNOSIS — E538 Deficiency of other specified B group vitamins: Secondary | ICD-10-CM

## 2024-04-11 DIAGNOSIS — K2100 Gastro-esophageal reflux disease with esophagitis, without bleeding: Secondary | ICD-10-CM

## 2024-04-11 DIAGNOSIS — G301 Alzheimer's disease with late onset: Secondary | ICD-10-CM

## 2024-04-11 DIAGNOSIS — R001 Bradycardia, unspecified: Secondary | ICD-10-CM

## 2024-04-11 DIAGNOSIS — R7301 Impaired fasting glucose: Secondary | ICD-10-CM

## 2024-04-11 DIAGNOSIS — Z23 Encounter for immunization: Secondary | ICD-10-CM

## 2024-04-11 DIAGNOSIS — I10 Essential (primary) hypertension: Secondary | ICD-10-CM

## 2024-04-11 DIAGNOSIS — E7800 Pure hypercholesterolemia, unspecified: Secondary | ICD-10-CM

## 2024-04-11 DIAGNOSIS — I2510 Atherosclerotic heart disease of native coronary artery without angina pectoris: Secondary | ICD-10-CM

## 2024-05-10 ENCOUNTER — Other Ambulatory Visit: Payer: Self-pay | Admitting: Nurse Practitioner

## 2024-05-12 NOTE — Telephone Encounter (Signed)
 Discontinued 12/12/23.  Requested Prescriptions  Pending Prescriptions Disp Refills   losartan  (COZAAR ) 25 MG tablet [Pharmacy Med Name: LOSARTAN  POTASSIUM 25 MG TAB] 90 tablet 4    Sig: TAKE 1 TABLET BY MOUTH ONCE DAILY     Cardiovascular:  Angiotensin Receptor Blockers Failed - 05/12/2024 10:51 AM      Failed - Cr in normal range and within 180 days    Creatinine, Ser  Date Value Ref Range Status  12/12/2023 1.54 (H) 0.76 - 1.27 mg/dL Final         Failed - Last BP in normal range    BP Readings from Last 1 Encounters:  12/12/23 (!) 74/42         Passed - K in normal range and within 180 days    Potassium  Date Value Ref Range Status  12/12/2023 4.3 3.5 - 5.2 mmol/L Final         Passed - Patient is not pregnant      Passed - Valid encounter within last 6 months    Recent Outpatient Visits           5 months ago Moderate late onset Alzheimer's dementia without behavioral disturbance, psychotic disturbance, mood disturbance, or anxiety (HCC)   [facility] [place], Melanie DASEN, NP

## 2024-06-11 ENCOUNTER — Telehealth: Payer: Self-pay

## 2024-06-11 NOTE — Progress Notes (Signed)
° °  06/11/2024  Patient ID: Michael Zuniga, male   DOB: 03/22/1951, 72 y.o.   MRN: [id number]  This patient is appearing on a report for being at risk of failing the adherence measure for hypertension (ACEi/ARB) medications this calendar year.   Medication: losartan  25mg  daily Last fill date: 01/23/24 for 90 day supply  Patient was instructed to stop losartan  25mg  in June after BP was found to be 74/42 at PCP Visit 6/16.  It appears he continued to take medication, but a refill request was denied to refill in November.  Patient missed the past 3 PCP follow-up visits and is currently due for a follow-up.  Channing DELENA Mealing, PharmD, DPLA

## 2024-06-12 NOTE — Telephone Encounter (Signed)
 Called patient and left a message to call back to get scheduled.

## 2024-06-14 ENCOUNTER — Telehealth: Payer: Self-pay

## 2024-06-14 NOTE — Progress Notes (Signed)
° °  06/14/2024  Patient ID: Michael Zuniga, male   DOB: [birth year], 73 y.o.   MRN: [id number]  Outreach attempt to make sure patient is aware he should no longer be taking losartan  and notify him he is overdue for a follow-up with PCP.  I was not able to reach the patient but did leave a HIPAA compliant voicemail with my direct number and the office number to call to schedule a follow-up with Jolene Cannady, NP.  Channing DELENA Mealing, PharmD, DPLA

## 2024-06-19 NOTE — Telephone Encounter (Signed)
 2nd attempt to reach for scheduling

## 2024-06-26 NOTE — Telephone Encounter (Signed)
 3 attempts made to reach patient for scheduling.
